# Patient Record
Sex: Male | Born: 2000
Health system: Southern US, Community
[De-identification: ages and names within clinical notes are randomized; demographics above are authoritative.]

## PROBLEM LIST (undated history)

## (undated) DIAGNOSIS — F32A Depression, unspecified: Secondary | ICD-10-CM

## (undated) DIAGNOSIS — N200 Calculus of kidney: Secondary | ICD-10-CM

## (undated) DIAGNOSIS — Z87442 Personal history of urinary calculi: Secondary | ICD-10-CM

## (undated) DIAGNOSIS — F419 Anxiety disorder, unspecified: Secondary | ICD-10-CM

## (undated) HISTORY — DX: Personal history of urinary calculi: Z87.442

## (undated) HISTORY — DX: Calculus of kidney: N20.0

## (undated) HISTORY — DX: Depression, unspecified: F32.A

## (undated) HISTORY — PX: NO PAST SURGERIES: SHX2092

## (undated) HISTORY — DX: Anxiety disorder, unspecified: F41.9

---
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
## 2019-01-04 ENCOUNTER — Encounter: Payer: Self-pay | Admitting: Family Medicine

## 2019-01-04 ENCOUNTER — Telehealth (INDEPENDENT_AMBULATORY_CARE_PROVIDER_SITE_OTHER): Payer: BC Managed Care – PPO | Admitting: Family Medicine

## 2019-01-04 DIAGNOSIS — F419 Anxiety disorder, unspecified: Secondary | ICD-10-CM | POA: Diagnosis not present

## 2019-01-04 DIAGNOSIS — F321 Major depressive disorder, single episode, moderate: Secondary | ICD-10-CM

## 2019-01-04 MED ORDER — FLUOXETINE HCL 10 MG PO CAPS: 10.0000 mg | ORAL_CAPSULE | Freq: Every day | ORAL | 3 refills | Status: DC

## 2019-01-04 NOTE — Progress Notes (Signed)
Virtual Visit via Video Note  I connected with Warren Cole on 01/04/19 at  3:30 PM EDT by a video enabled telemedicine application and verified that I am speaking with the correct person using two identifiers. Location patient: home Location provider: work or home office Persons participating in the virtual visit: patient, provider  I discussed the limitations of evaluation and management by telemedicine and the availability of in person appointments. The patient expressed understanding and agreed to proceed.  Chief Complaint  Patient presents with  . Anxiety    anxiety and depression consult     HPI:  Warren Cole is a 18 y.o. male here to discuss concerns about anxiety/depression. Pt states he has had anxiety "his whole life" but in the past few months it has been "super bad". He states he is "over-thinking". Pt notes trouble falling and staying asleep, decreased appetite, trouble concentrating, feels very worried but can't pinpoint why.  Denies SI.  Pt has been taking melatonin 5-10mg  qHS for a few nights.   Psychiatrist or therapist: pt saw a therapist in elementary in school for anxiety Previous meds: none  Depression screen PHQ 2/9 01/04/2019  Decreased Interest 1  Down, Depressed, Hopeless 3  PHQ - 2 Score 4  Altered sleeping 3  Tired, decreased energy 3  Change in appetite 3  Feeling bad or failure about yourself  2  Trouble concentrating 1  Moving slowly or fidgety/restless 0  Suicidal thoughts 0  PHQ-9 Score 16   GAD 7 : Generalized Anxiety Score 01/04/2019  Nervous, Anxious, on Edge 3  Control/stop worrying 3  Worry too much - different things 3  Trouble relaxing 3  Restless 2  Easily annoyed or irritable 2  Afraid - awful might happen 2  Total GAD 7 Score 18    Past Medical History:  Diagnosis Date  . Asthma   . Kidney stones 2018    No past surgical history on file.  Family History  Problem Relation Cole of Onset  . Cancer Maternal  Grandmother   . Heart disease Maternal Grandmother   . Cancer Maternal Grandfather   . Hyperlipidemia Maternal Grandfather   . Hypertension Maternal Grandfather   . Cancer Paternal Grandmother   . Alcohol abuse Paternal Grandmother   . Cancer Paternal Grandfather   . Alcohol abuse Paternal Grandfather     Social History   Tobacco Use  . Smoking status: Never Smoker  . Smokeless tobacco: Never Used  Substance Use Topics  . Alcohol use: No  . Drug use: Not on file     Current Outpatient Medications:  .  FLUoxetine (PROZAC) 10 MG capsule, Take 1 capsule (10 mg total) by mouth daily., Disp: 90 capsule, Rfl: 3  No Known Allergies    ROS: See pertinent positives and negatives per HPI.   EXAM:  VITALS per patient if applicable: There were no vitals taken for this visit.   GENERAL: alert, oriented, appears well and in no acute distress  NECK: normal movements of the head and neck  LUNGS: on inspection no signs of respiratory distress, breathing rate appears normal, no obvious gross SOB, gasping or wheezing, no conversational dyspnea  CV: no obvious cyanosis  MS: moves all visible extremities without noticeable abnormality  PSYCH/NEURO: pleasant and cooperative, speech and thought processing grossly intact; somewhat flat affect, mostly one-word answers   ASSESSMENT AND PLAN: 1. Anxiety 2. Depression, major, single episode, moderate (HCC) - GAD-7 = 18 (severe), PHQ-9 = 16 (moderately severe)  Rx: - FLUoxetine (PROZAC) 10 MG capsule; Take 1 capsule (10 mg total) by mouth daily.  Dispense: 90 capsule; Refill: 3 - info given re: [facility] counseling and will send via MyChart - f/u in 3-4 wks or sooner PRN    I discussed the assessment and treatment plan with the patient. The patient was provided an opportunity to ask questions and all were answered. The patient agreed with the plan and demonstrated an understanding of the instructions.   The patient was advised to call back  or seek an in-person evaluation if the symptoms worsen or if the condition fails to improve as anticipated.   Luana ShuMary Virga Haltiwanger, DO

## 2019-02-20 ENCOUNTER — Encounter: Payer: Self-pay | Admitting: Family Medicine

## 2019-02-20 DIAGNOSIS — F419 Anxiety disorder, unspecified: Secondary | ICD-10-CM

## 2019-02-20 MED ORDER — FLUOXETINE HCL 20 MG PO CAPS: 20.0000 mg | ORAL_CAPSULE | Freq: Every day | ORAL | 3 refills | Status: DC

## 2019-04-12 ENCOUNTER — Encounter: Payer: Self-pay | Admitting: Family Medicine

## 2019-04-18 MED ORDER — ESCITALOPRAM OXALATE 10 MG PO TABS: ORAL_TABLET | ORAL | 3 refills | Status: DC

## 2019-05-15 IMAGING — DX DG FOOT COMPLETE 3+V*L*
3 series · 3 of 3 positions shown · non-contrast
Comparison: None.

CLINICAL DATA: 16 y/o  M; lateral foot pain after inversion.

EXAM:
LEFT FOOT - COMPLETE 3+ VIEW

[foot ap]
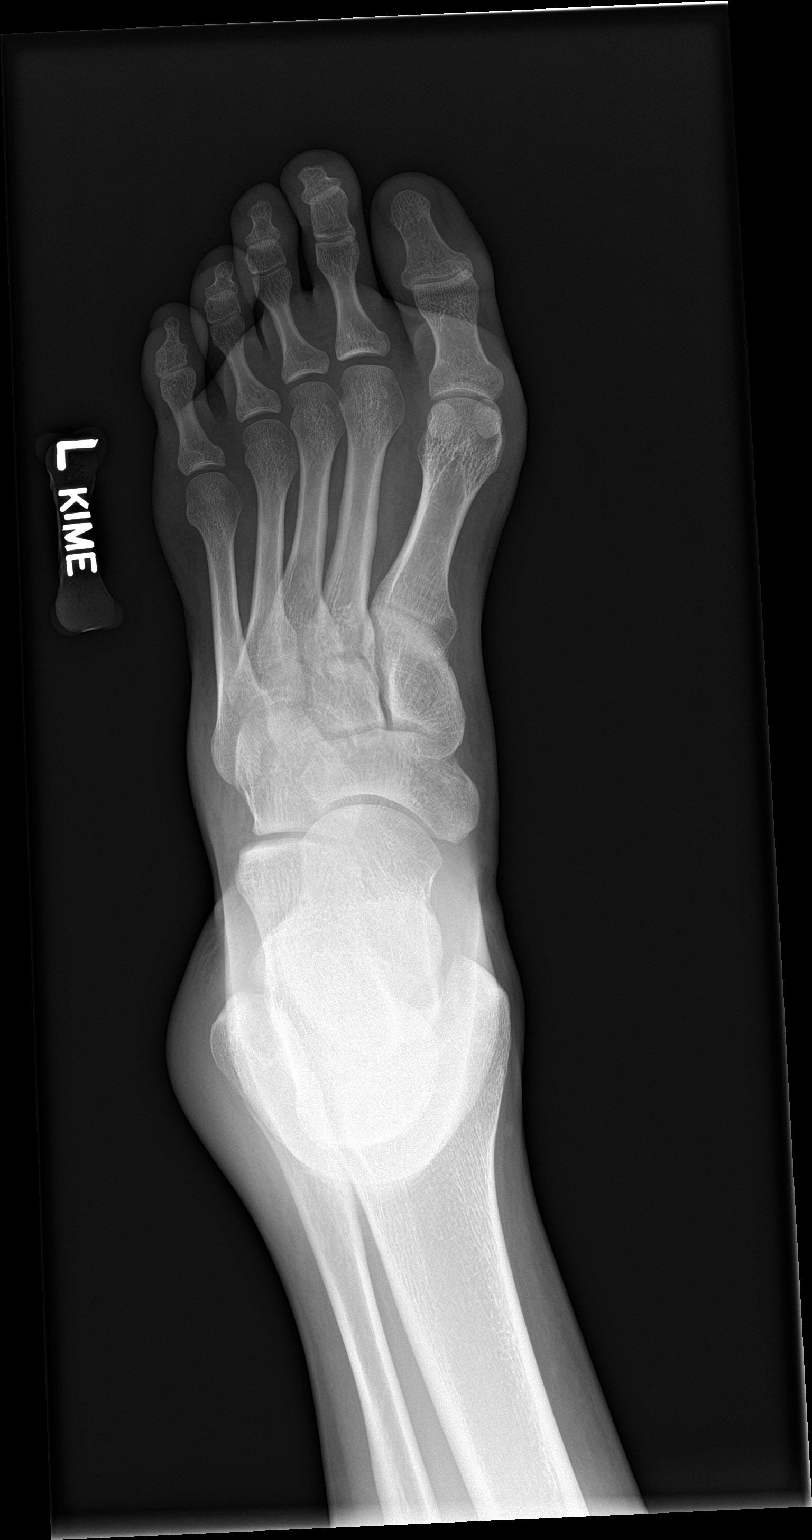

[foot obl]
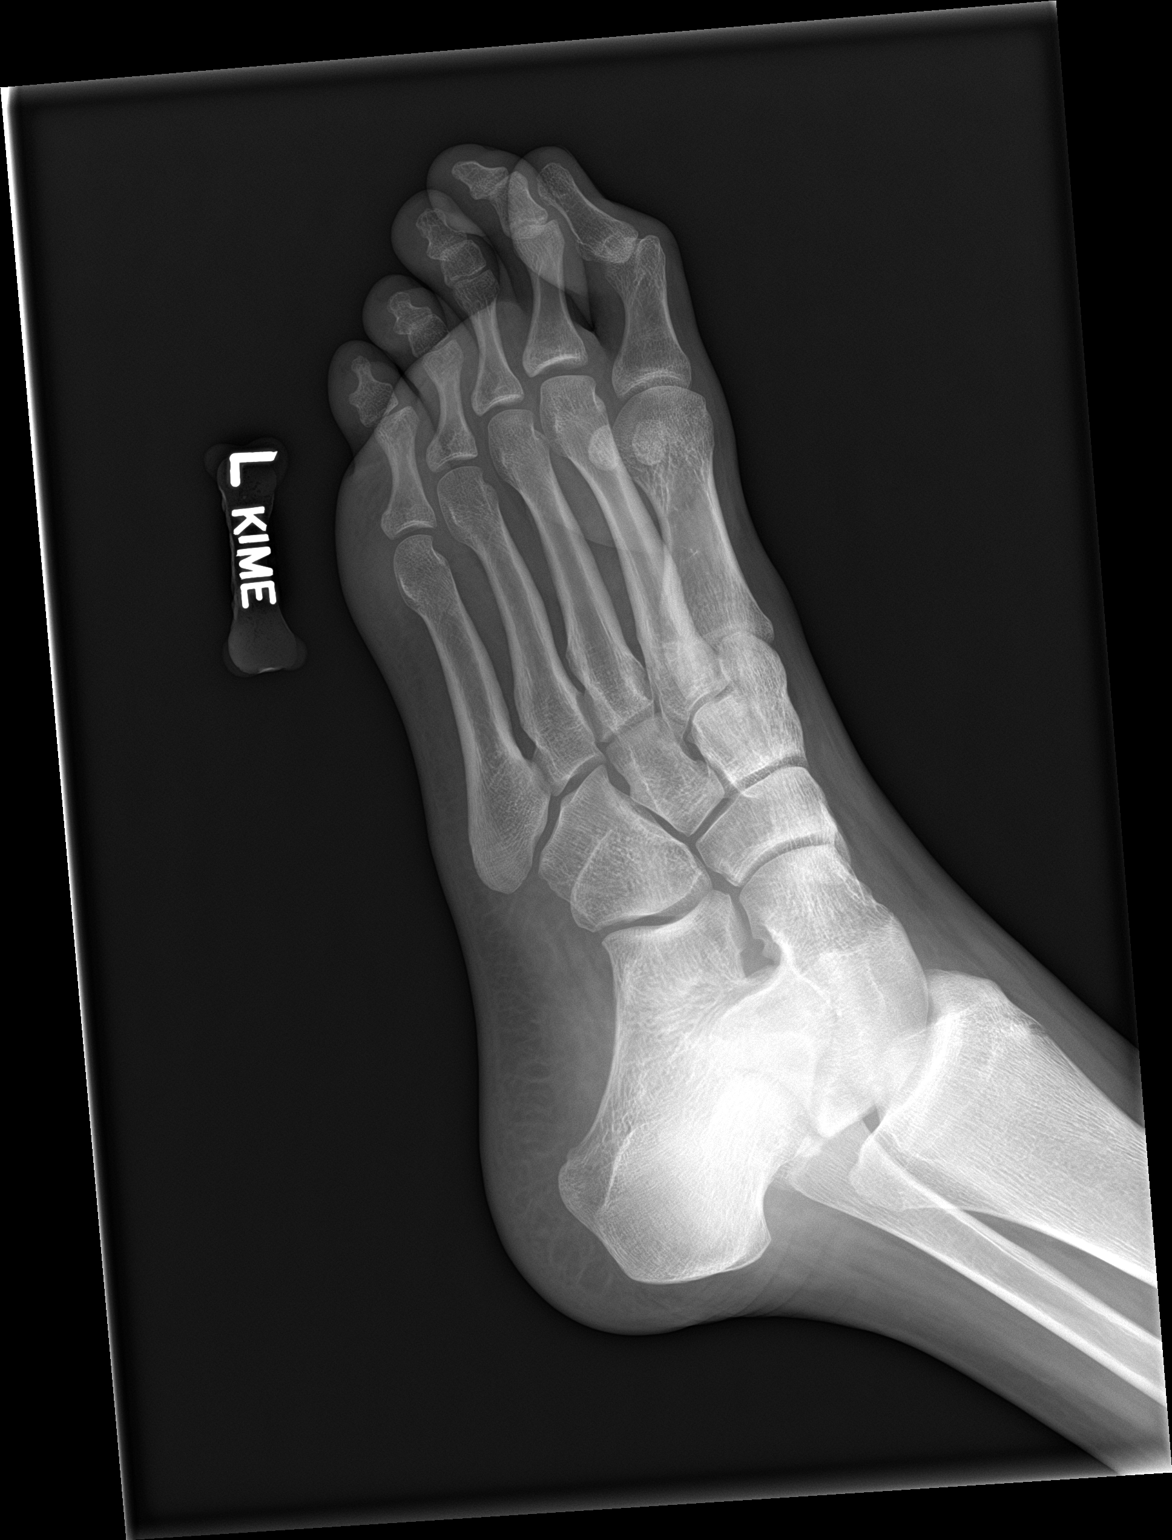

[foot lat]
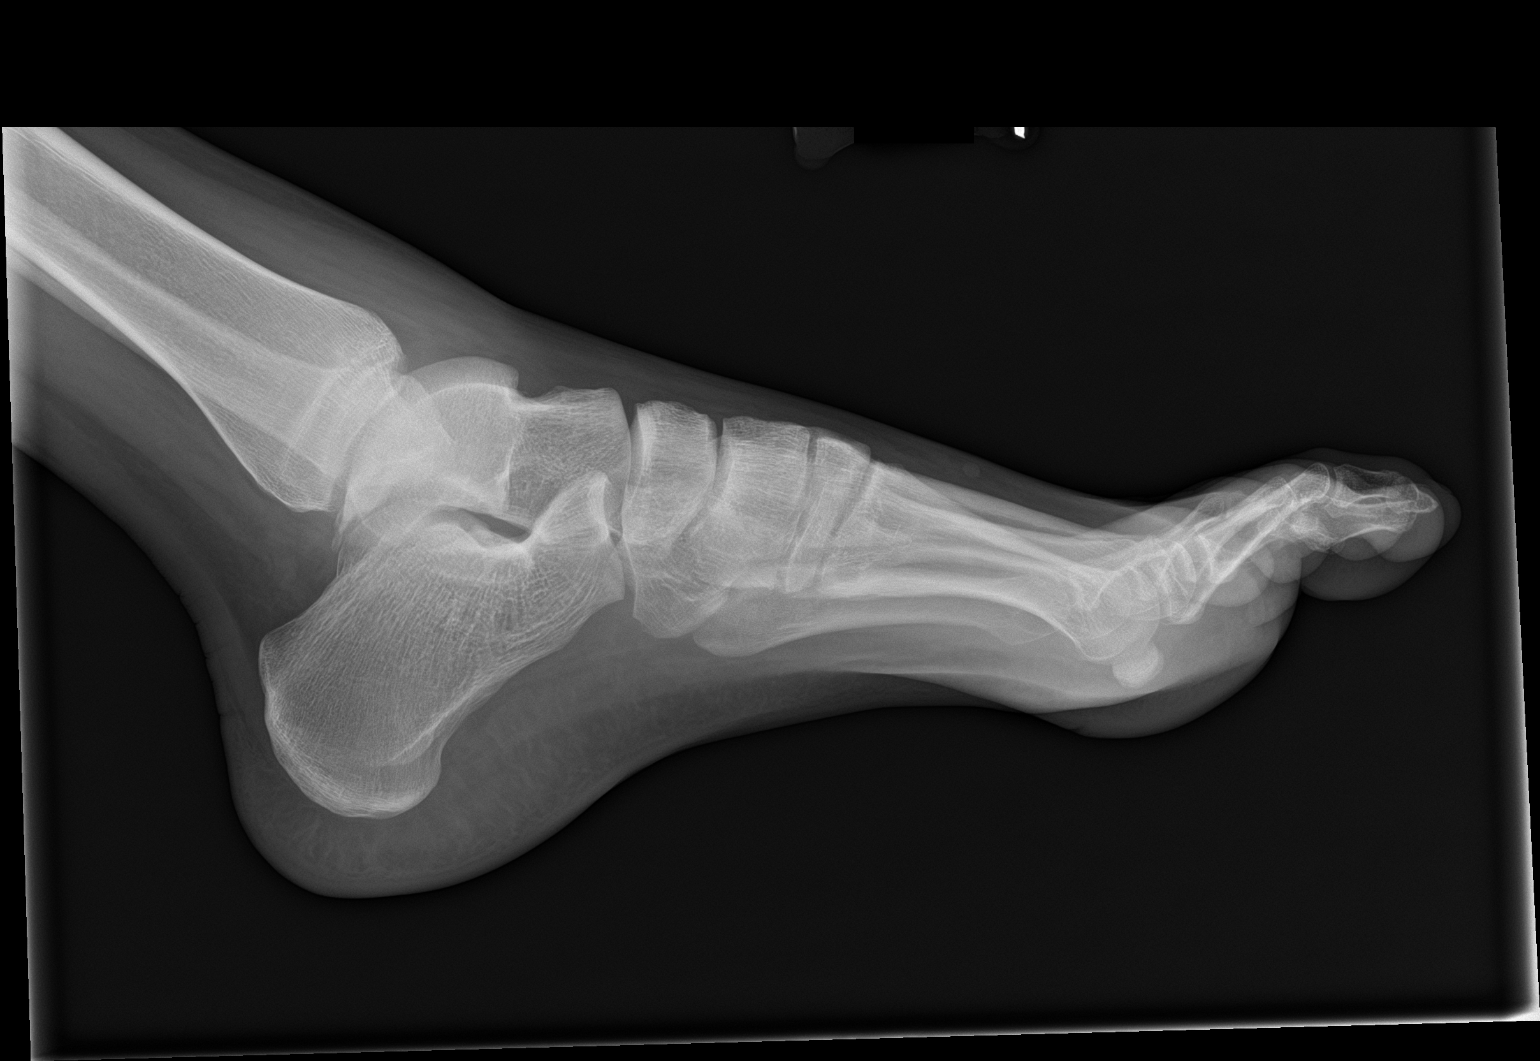

[3 of 3 positions shown; findings below may reference images not displayed]

FINDINGS: There is no evidence of fracture or dislocation. There is no
evidence of arthropathy or other focal bone abnormality. Soft
tissues are unremarkable.
IMPRESSION: Negative.

By: Daman Cayo M.D.

## 2019-05-15 IMAGING — DX DG ANKLE COMPLETE 3+V*L*
3 series · 3 of 3 positions shown · non-contrast
Comparison: None.

CLINICAL DATA: Basketball injury to the left ankle with swelling
and pain.

EXAM:
LEFT ANKLE COMPLETE - 3+ VIEW

[ankle ap]
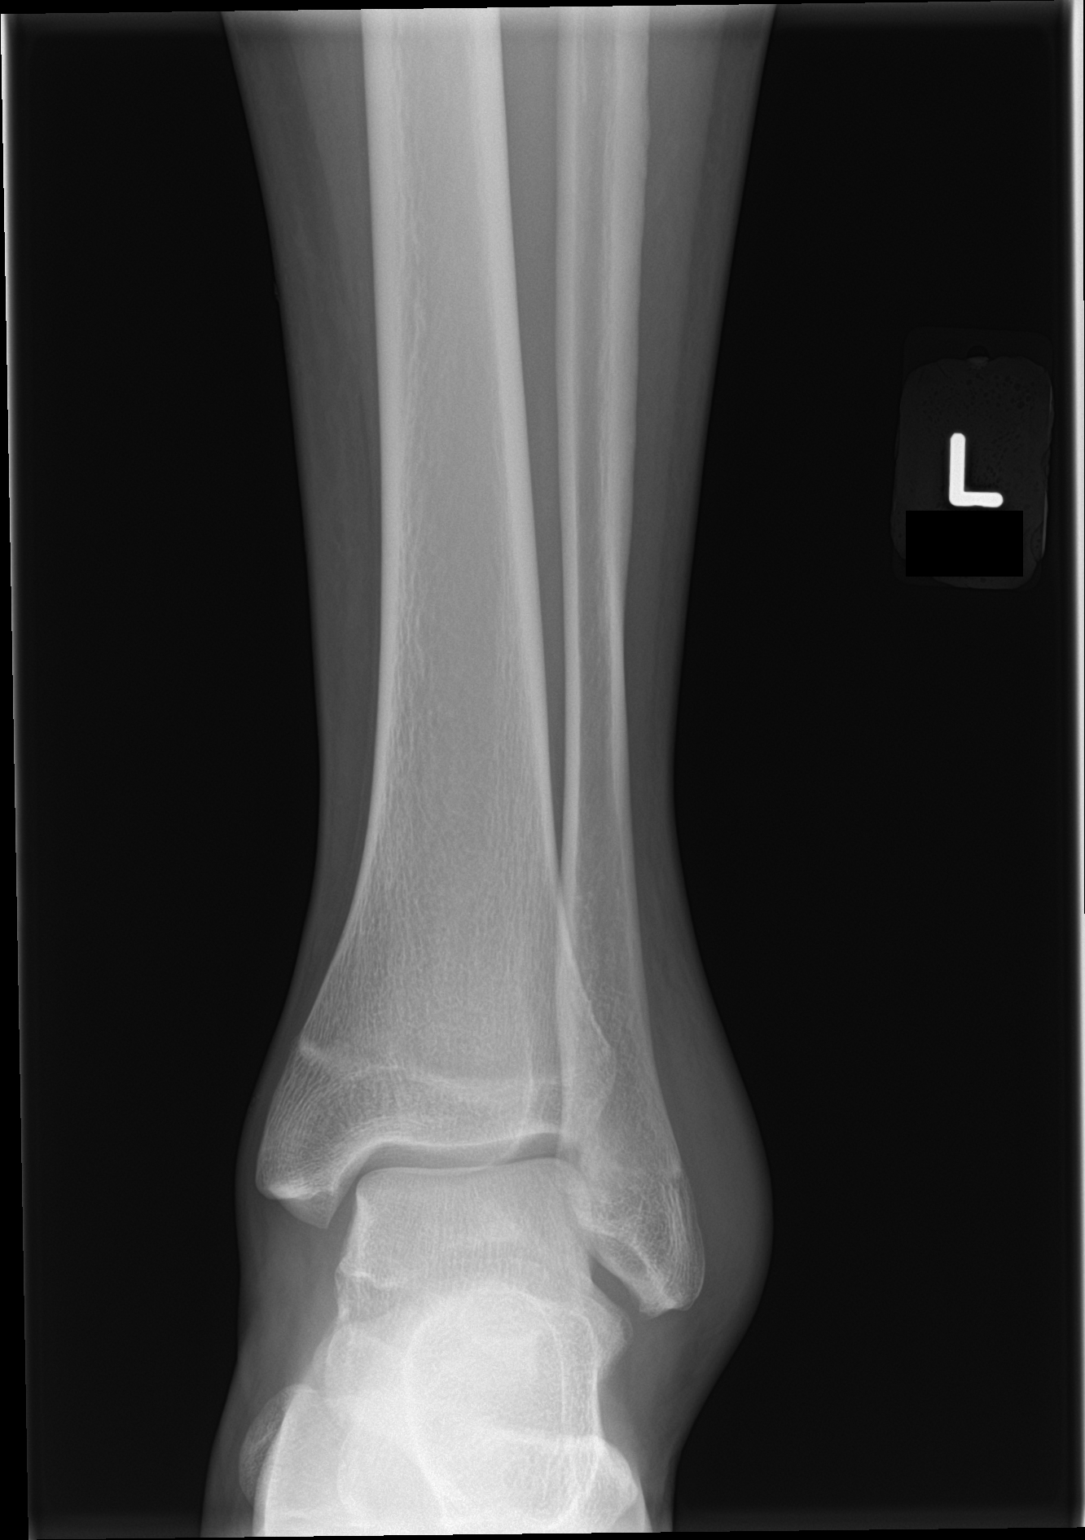

[ankle obl]
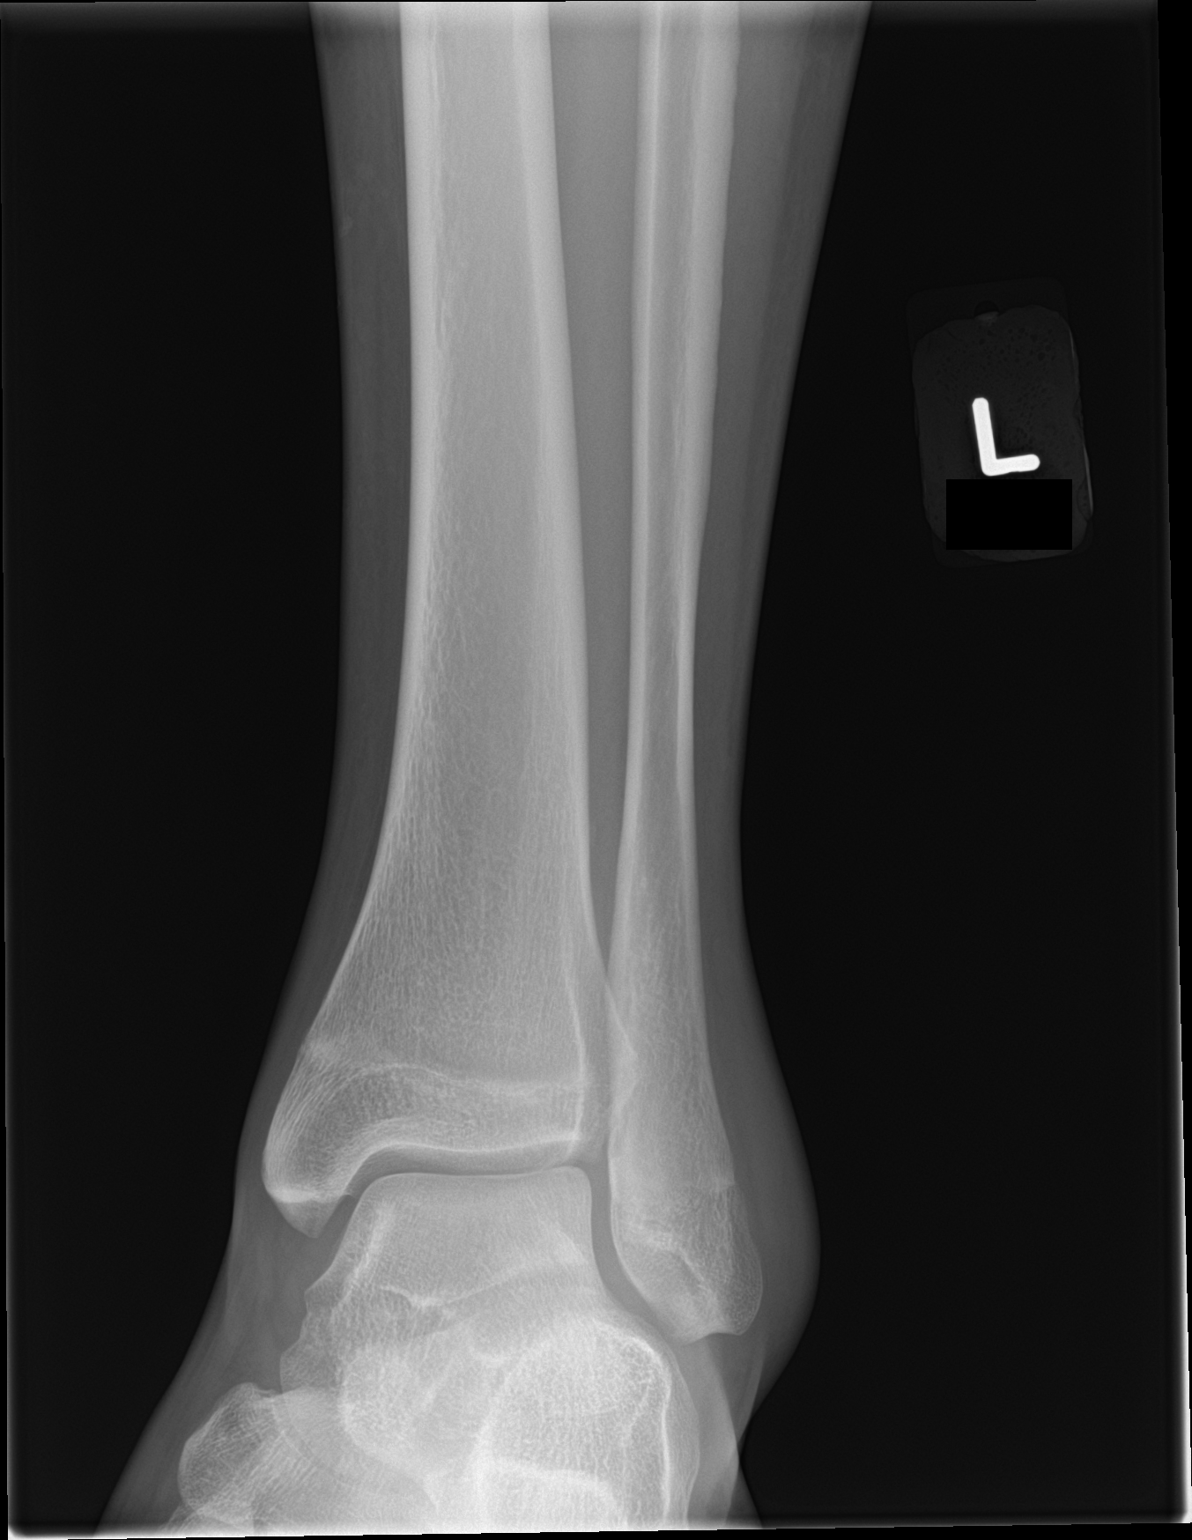

[ankle lat]
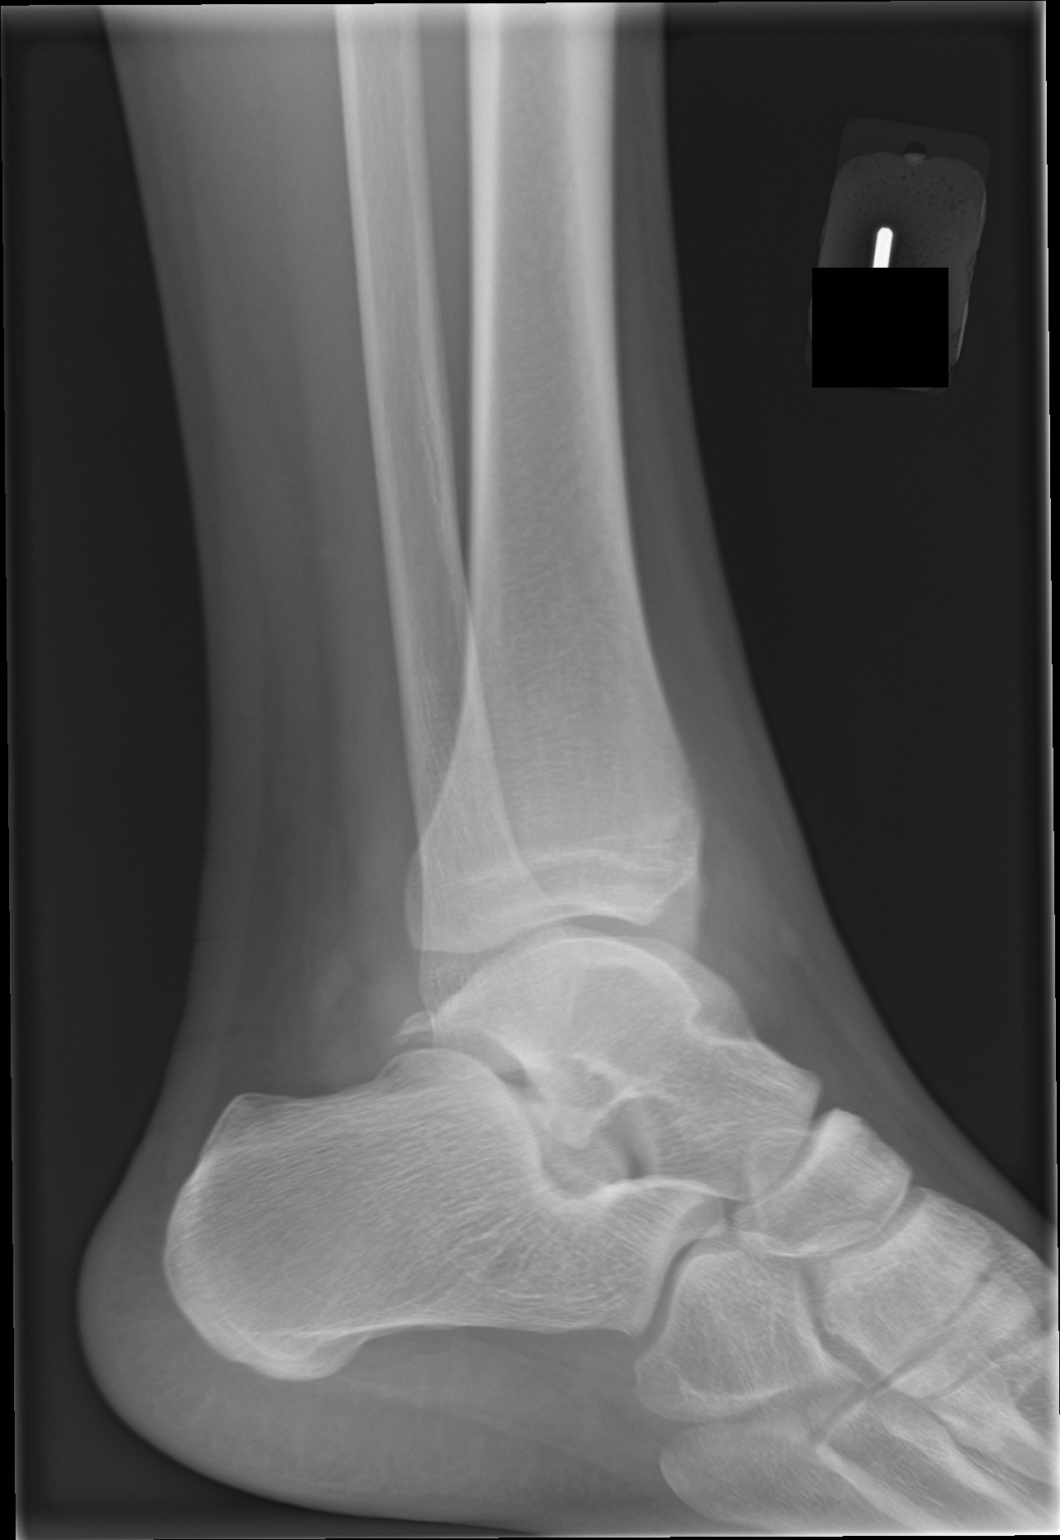

[3 of 3 positions shown; findings below may reference images not displayed]

FINDINGS: Soft tissue swelling about the lateral aspect of the left ankle. No
evidence of acute fracture or dislocation. No focal bone lesion or
bone destruction. Ankle mortise and talar dome appear intact. Joint
spaces are preserved.
IMPRESSION: Lateral soft tissue swelling over the left ankle. No acute bony
abnormalities.

## 2019-06-26 ENCOUNTER — Encounter: Payer: Self-pay | Admitting: Family Medicine

## 2019-06-26 DIAGNOSIS — F321 Major depressive disorder, single episode, moderate: Secondary | ICD-10-CM

## 2019-06-26 DIAGNOSIS — F419 Anxiety disorder, unspecified: Secondary | ICD-10-CM

## 2019-06-27 ENCOUNTER — Encounter: Payer: Self-pay | Admitting: Family Medicine

## 2019-06-27 MED ORDER — ESCITALOPRAM OXALATE 20 MG PO TABS: 20.0000 mg | ORAL_TABLET | Freq: Every day | ORAL | 0 refills | Status: DC

## 2019-07-02 DIAGNOSIS — Z20828 Contact with and (suspected) exposure to other viral communicable diseases: Secondary | ICD-10-CM | POA: Diagnosis not present

## 2019-07-02 DIAGNOSIS — Z9189 Other specified personal risk factors, not elsewhere classified: Secondary | ICD-10-CM | POA: Diagnosis not present

## 2019-08-21 ENCOUNTER — Encounter: Payer: Self-pay | Admitting: Family Medicine

## 2019-08-21 DIAGNOSIS — F419 Anxiety disorder, unspecified: Secondary | ICD-10-CM

## 2019-08-21 DIAGNOSIS — F321 Major depressive disorder, single episode, moderate: Secondary | ICD-10-CM

## 2019-08-30 ENCOUNTER — Encounter: Payer: BC Managed Care – PPO | Admitting: Family Medicine

## 2019-09-07 ENCOUNTER — Telehealth: Payer: Self-pay | Admitting: Family Medicine

## 2019-09-07 ENCOUNTER — Encounter: Payer: Self-pay | Admitting: Nurse Practitioner

## 2019-09-07 ENCOUNTER — Other Ambulatory Visit: Payer: Self-pay

## 2019-09-07 ENCOUNTER — Telehealth (INDEPENDENT_AMBULATORY_CARE_PROVIDER_SITE_OTHER): Payer: PRIVATE HEALTH INSURANCE | Admitting: Nurse Practitioner

## 2019-09-07 VITALS — HR 81 | Ht 75.23 in | Wt 214.0 lb

## 2019-09-07 DIAGNOSIS — J069 Acute upper respiratory infection, unspecified: Secondary | ICD-10-CM

## 2019-09-07 DIAGNOSIS — U071 COVID-19: Secondary | ICD-10-CM | POA: Diagnosis not present

## 2019-09-07 MED ORDER — HYDROCODONE-HOMATROPINE 5-1.5 MG/5ML PO SYRP: 5.0000 mL | ORAL_SOLUTION | Freq: Two times a day (BID) | ORAL | 0 refills | Status: DC | PRN

## 2019-09-07 MED ORDER — GUAIFENESIN-DM 100-10 MG/5ML PO SYRP: 5.0000 mL | ORAL_SOLUTION | ORAL | 0 refills | Status: DC | PRN

## 2019-09-07 MED ORDER — ACETAMINOPHEN 500 MG PO TABS: 500.0000 mg | ORAL_TABLET | Freq: Four times a day (QID) | ORAL | 0 refills | Status: DC | PRN

## 2019-09-07 MED ORDER — FLUTICASONE PROPIONATE 50 MCG/ACT NA SUSP: 2.0000 | Freq: Every day | NASAL | 0 refills | Status: DC

## 2019-09-07 NOTE — Telephone Encounter (Signed)
It is under the letter section. Pt is aware.

## 2019-09-07 NOTE — Telephone Encounter (Signed)
Patient is calling back and stated that he had a mychart visit today with Claris Gower and was suppose to receive a doctors note but hasn't yet. Pls advise. CB is [phone]

## 2019-09-07 NOTE — Progress Notes (Signed)
Virtual Visit via Video Note  I connected with@ on 09/07/19 at 12:30 PM EST by a video enabled telemedicine application and verified that I am speaking with the correct person using two identifiers.  Location: Patient:Home Provider: Office Participants: patient and provider  I discussed the limitations of evaluation and management by telemedicine and the availability of in person appointments. I also discussed with the patient that there may be a patient responsible charge related to this service. The patient expressed understanding and agreed to proceed.  CC:  History of Present Illness: Sore Throat  This is a new problem. The current episode started 1 to 4 weeks ago. The problem has been waxing and waning. There has been no fever. Associated symptoms include congestion, coughing and trouble swallowing. Pertinent negatives include no abdominal pain, diarrhea, drooling, ear discharge, ear pain, headaches, hoarse voice, plugged ear sensation, neck pain, shortness of breath, stridor, swollen glands or vomiting. He has had no exposure to strep or mono. He has tried nothing for the symptoms.   Observations/Objective: Physical Exam  Constitutional: He is oriented to person, place, and time.  HENT:  Right Ear: External ear normal.  Left Ear: External ear normal.  Mouth/Throat: Uvula is midline. No trismus in the jaw. Posterior oropharyngeal erythema present. No oropharyngeal exudate or posterior oropharyngeal edema.  Eyes: Conjunctivae and EOM are normal.  Pulmonary/Chest: Effort normal.  Musculoskeletal:     Cervical back: Normal range of motion and neck supple.  Lymphadenopathy:    He has no cervical adenopathy.  Neurological: He is alert and oriented to person, place, and time.  Skin: Skin is warm and dry. No rash noted.  Psychiatric: He has a normal mood and affect. His behavior is normal. Thought content normal.   Assessment and Plan: Corrigan was seen today for sore  throat.  Diagnoses and all orders for this visit:  COVID-19 -     HYDROcodone-homatropine (HYCODAN) 5-1.5 MG/5ML syrup; Take 5 mLs by mouth every 12 (twelve) hours as needed. -     guaiFENesin-dextromethorphan (ROBITUSSIN DM) 100-10 MG/5ML syrup; Take 5 mLs by mouth every 4 (four) hours as needed for cough. -     acetaminophen (TYLENOL) 500 MG tablet; Take 1 tablet (500 mg total) by mouth every 6 (six) hours as needed. -     fluticasone (FLONASE) 50 MCG/ACT nasal spray; Place 2 sprays into both nostrils daily.  Viral upper respiratory tract infection -     HYDROcodone-homatropine (HYCODAN) 5-1.5 MG/5ML syrup; Take 5 mLs by mouth every 12 (twelve) hours as needed. -     guaiFENesin-dextromethorphan (ROBITUSSIN DM) 100-10 MG/5ML syrup; Take 5 mLs by mouth every 4 (four) hours as needed for cough. -     acetaminophen (TYLENOL) 500 MG tablet; Take 1 tablet (500 mg total) by mouth every 6 (six) hours as needed. -     fluticasone (FLONASE) 50 MCG/ACT nasal spray; Place 2 sprays into both nostrils daily.   Follow Up Instructions: See avs   I discussed the assessment and treatment plan with the patient. The patient was provided an opportunity to ask questions and all were answered. The patient agreed with the plan and demonstrated an understanding of the instructions.   The patient was advised to call back or seek an in-person evaluation if the symptoms worsen or if the condition fails to improve as anticipated.   Alysia Penna, NP

## 2019-09-09 ENCOUNTER — Encounter: Payer: Self-pay | Admitting: Nurse Practitioner

## 2019-09-12 ENCOUNTER — Encounter: Payer: Self-pay | Admitting: Nurse Practitioner

## 2019-09-12 DIAGNOSIS — J069 Acute upper respiratory infection, unspecified: Secondary | ICD-10-CM

## 2019-09-13 MED ORDER — BENZONATATE 100 MG PO CAPS: 100.0000 mg | ORAL_CAPSULE | Freq: Three times a day (TID) | ORAL | 0 refills | Status: DC | PRN

## 2019-10-23 NOTE — Patient Instructions (Signed)
Health Maintenance Due  Topic Date Due  . HIV Screening  Never done  . TETANUS/TDAP  Never done  . INFLUENZA VACCINE  03/03/2019    Depression screen PHQ 2/9 01/04/2019  Decreased Interest 1  Down, Depressed, Hopeless 3  PHQ - 2 Score 4  Altered sleeping 3  Tired, decreased energy 3  Change in appetite 3  Feeling bad or failure about yourself  2  Trouble concentrating 1  Moving slowly or fidgety/restless 0  Suicidal thoughts 0  PHQ-9 Score 16

## 2019-10-24 ENCOUNTER — Other Ambulatory Visit: Payer: Self-pay

## 2019-10-24 ENCOUNTER — Encounter: Payer: PRIVATE HEALTH INSURANCE | Admitting: Family Medicine

## 2019-10-25 ENCOUNTER — Ambulatory Visit (INDEPENDENT_AMBULATORY_CARE_PROVIDER_SITE_OTHER): Payer: PRIVATE HEALTH INSURANCE | Admitting: Family Medicine

## 2019-10-25 ENCOUNTER — Encounter: Payer: Self-pay | Admitting: Family Medicine

## 2019-10-25 VITALS — BP 120/70 | HR 82 | Temp 97.6°F | Ht 74.5 in | Wt 227.0 lb

## 2019-10-25 DIAGNOSIS — Z0000 Encounter for general adult medical examination without abnormal findings: Secondary | ICD-10-CM | POA: Diagnosis not present

## 2019-10-25 DIAGNOSIS — F321 Major depressive disorder, single episode, moderate: Secondary | ICD-10-CM | POA: Diagnosis not present

## 2019-10-25 NOTE — Progress Notes (Signed)
Warren Cole is a 19 y.o. male  Chief Complaint  Patient presents with  . Annual Exam    Pt has no complaints.  He is not fasting for labs today. Pt recieved the [employer] vaccine last Friday.    HPI: Warren Cole is a 19 y.o. male here for annual exams.  Psychiatrist recently started him on zoloft 50mg  daily and ? Hydroxyzine qHS PRN. He is unsure of name of psych, seen x 1.   10/19/19 - COVID vaccine Diet/Exercise: average diet, regular CV exercerise Denitst: due and has appt next week Vision: no issues/concerns, does not wear glasses or contacts  Med refills needed today: no  Past Medical History:  Diagnosis Date  . Asthma   . Kidney stones 2018    History reviewed. No pertinent surgical history.  Social History   Socioeconomic History  . Marital status: Single    Spouse name: Not on file  . Number of children: Not on file  . Years of education: Not on file  . Highest education level: Not on file  Occupational History  . Not on file  Tobacco Use  . Smoking status: Never Smoker  . Smokeless tobacco: Never Used  Substance and Sexual Activity  . Alcohol use: No  . Drug use: Not on file  . Sexual activity: Not on file  Other Topics Concern  . Not on file  Social History Narrative  . Not on file   Social Determinants of Health   Financial Resource Strain:   . Difficulty of Paying Living Expenses:   Food Insecurity:   . Worried About Charity fundraiser in the Last Year:   . Arboriculturist in the Last Year:   Transportation Needs:   . Film/video editor (Medical):   Marland Kitchen Lack of Transportation (Non-Medical):   Physical Activity:   . Days of Exercise per Week:   . Minutes of Exercise per Session:   Stress:   . Feeling of Stress :   Social Connections:   . Frequency of Communication with Friends and Family:   . Frequency of Social Gatherings with Friends and Family:   . Attends Religious Services:   . Active Member of  Clubs or Organizations:   . Attends Archivist Meetings:   Marland Kitchen Marital Status:   Intimate Partner Violence:   . Fear of Current or Ex-Partner:   . Emotionally Abused:   Marland Kitchen Physically Abused:   . Sexually Abused:     Family History  Problem Relation Age of Onset  . Cancer Maternal Grandmother   . Heart disease Maternal Grandmother   . Cancer Maternal Grandfather   . Hyperlipidemia Maternal Grandfather   . Hypertension Maternal Grandfather   . Cancer Paternal Grandmother   . Alcohol abuse Paternal Grandmother   . Cancer Paternal Grandfather   . Alcohol abuse Paternal Grandfather       There is no immunization history on file for this patient.  Outpatient Encounter Medications as of 10/25/2019  Medication Sig  . acetaminophen (TYLENOL) 500 MG tablet Take 1 tablet (500 mg total) by mouth every 6 (six) hours as needed.  . fluticasone (FLONASE) 50 MCG/ACT nasal spray Place 2 sprays into both nostrils daily.  . sertraline (ZOLOFT) 50 MG tablet Take 50 mg by mouth daily.  . [DISCONTINUED] benzonatate (TESSALON) 100 MG capsule Take 1 capsule (100 mg total) by mouth 3 (three) times daily as needed for cough.  . [DISCONTINUED] escitalopram (  LEXAPRO) 20 MG tablet Take 1 tablet (20 mg total) by mouth at bedtime. (Patient not taking: Reported on 10/25/2019)  . [DISCONTINUED] guaiFENesin-dextromethorphan (ROBITUSSIN DM) 100-10 MG/5ML syrup Take 5 mLs by mouth every 4 (four) hours as needed for cough. (Patient not taking: Reported on 10/25/2019)  . [DISCONTINUED] HYDROcodone-homatropine (HYCODAN) 5-1.5 MG/5ML syrup Take 5 mLs by mouth every 12 (twelve) hours as needed. (Patient not taking: Reported on 10/25/2019)   No facility-administered encounter medications on file as of 10/25/2019.     ROS: Gen: no fever, chills  Skin: no rash, itching ENT: no ear pain, ear drainage, nasal congestion, rhinorrhea, sinus pressure, sore throat Eyes: no blurry vision, double vision Resp: no cough,  wheeze,SOB CV: no CP, palpitations, LE edema,  GI: no heartburn, n/v/d/c, abd pain GU: no dysuria, urgency, frequency, hematuria; no testicular swelling or masses MSK: no joint pain, myalgias, back pain Neuro: no dizziness, headache, weakness, vertigo Psych: no depression, anxiety, insomnia   No Known Allergies  BP 120/70 (BP Location: Left Arm, Patient Position: Sitting, Cuff Size: Normal)   Pulse 82   Temp 97.6 F (36.4 C) (Temporal)   Ht 6' 2.5" (1.892 m)   Wt 227 lb (103 kg)   SpO2 97%   BMI 28.76 kg/m   Physical Exam  Constitutional: He is oriented to person, place, and time. He appears well-developed and well-nourished. No distress.  HENT:  Head: Normocephalic and atraumatic.  Right Ear: Tympanic membrane and ear canal normal.  Left Ear: Tympanic membrane and ear canal normal.  Nose: Nose normal.  Mouth/Throat: Oropharynx is clear and moist and mucous membranes are normal.  Neck: No thyromegaly present.  Cardiovascular: Normal rate, regular rhythm, normal heart sounds and intact distal pulses.  Pulmonary/Chest: Effort normal and breath sounds normal. He has no wheezes. He has no rhonchi.  Abdominal: Soft. Bowel sounds are normal. He exhibits no distension and no mass. There is no abdominal tenderness. There is no rebound and no guarding.  Musculoskeletal:        General: No edema. Normal range of motion.     Cervical back: Neck supple.  Lymphadenopathy:    He has no cervical adenopathy.  Neurological: He is alert and oriented to person, place, and time. He exhibits normal muscle tone. Coordination normal.  Skin: Skin is warm and dry.  Psychiatric: He has a normal mood and affect.     A/P:  1. Annual physical exam - discussed importance of regular CV exercise, healthy diet, adequate sleep - immunizations UTD - dental appt scheduled - will plan on labs at CPE next year, pt is not fasting today - next CPE in 1 year  2. Depression, major, single episode, moderate  (HCC) - has established with psychiatry and had 1 visit - started on zoloft 50mg  daily and PRN ? Hydroxyzine (pt unsure of med name/dose) - doing well overall  This visit occurred during the SARS-CoV-2 public health emergency.  Safety protocols were in place, including screening questions prior to the visit, additional usage of staff PPE, and extensive cleaning of exam room while observing appropriate contact time as indicated for disinfecting solutions.

## 2019-11-23 ENCOUNTER — Encounter: Payer: Self-pay | Admitting: Family Medicine

## 2021-03-09 ENCOUNTER — Encounter: Payer: PRIVATE HEALTH INSURANCE | Admitting: Family Medicine

## 2021-03-26 ENCOUNTER — Ambulatory Visit: Payer: No Typology Code available for payment source | Admitting: Nurse Practitioner

## 2021-09-11 ENCOUNTER — Telehealth: Payer: Self-pay | Admitting: Family Medicine

## 2021-09-11 NOTE — Telephone Encounter (Signed)
Pt's mom is requesting TOC to [facility]. Please advise.

## 2021-10-06 ENCOUNTER — Encounter: Payer: Self-pay | Admitting: Family Medicine

## 2021-10-06 ENCOUNTER — Ambulatory Visit (INDEPENDENT_AMBULATORY_CARE_PROVIDER_SITE_OTHER): Payer: PRIVATE HEALTH INSURANCE | Admitting: Family Medicine

## 2021-10-06 VITALS — BP 129/73 | HR 76 | Ht 75.0 in | Wt 253.4 lb

## 2021-10-06 DIAGNOSIS — Z23 Encounter for immunization: Secondary | ICD-10-CM

## 2021-10-06 DIAGNOSIS — F32A Depression, unspecified: Secondary | ICD-10-CM | POA: Diagnosis not present

## 2021-10-06 DIAGNOSIS — F419 Anxiety disorder, unspecified: Secondary | ICD-10-CM | POA: Diagnosis not present

## 2021-10-06 MED ORDER — SERTRALINE HCL 50 MG PO TABS: 50.0000 mg | ORAL_TABLET | Freq: Every day | ORAL | 1 refills | Status: DC

## 2021-10-06 NOTE — Patient Instructions (Signed)
Thank you for choosing [facility] for your Primary Care needs. I am excited for the opportunity to partner with you to meet your health care goals. It was a pleasure meeting you today! ° ° ° °Information on diet, exercise, and health maintenance recommendations are listed below. This is information to help you be sure you are on track for optimal health and monitoring.  ° °Please look over this and let us know if you have any questions or if you have completed any of the health maintenance outside of [place] so that we can be sure your records are up to date.  °___________________________________________________________ ° °MyChart:  °For all urgent or time sensitive needs we ask that you please call the office to avoid delays. Our number is [phone]. °MyChart is not constantly monitored and due to the large volume of messages a day, replies may take up to 72 business hours. ° °MyChart Policy: °MyChart allows for you to see your visit notes, after visit summary, provider recommendations, lab and tests results, make an appointment, request refills, and contact your provider or the office for non-urgent questions or concerns. Providers are seeing patients during normal business hours and do not have built in time to review MyChart messages.  °We ask that you allow a minimum of 3 business days for responses to MyChart messages. For this reason, please do not send urgent requests through MyChart. Please call the office at [phone]. °New and ongoing conditions may require a visit. We have virtual and in-person visits available for your convenience.  °Complex MyChart concerns may require a visit. Your provider may request you schedule a virtual or in-person visit to ensure we are providing the best care possible. °MyChart messages sent after 11:00 AM on Friday will not be received by the provider until Monday morning.  °  °Lab and Test Results: °You will receive your lab and  test results on MyChart as soon as they are completed and results have been sent by the lab or testing facility. Due to this service, you will receive your results BEFORE your provider.  °I review lab and test results each morning prior to seeing patients. Some results require collaboration with other providers to ensure you are receiving the most appropriate care. For this reason, we ask that you please allow a minimum of 3-5 business days from the time that ALL results have been received for your provider to receive and review lab and test results and contact you about these.  °Most lab and test result comments from the provider will be sent through MyChart. Your provider may recommend changes to the plan of care, follow-up visits, repeat testing, ask questions, or request an office visit to discuss these results. You may reply directly to this message or call the office to provide information for the provider or set up an appointment. °In some instances, you will be called with test results and recommendations. Please let us know if this is preferred and we will make note of this in your chart to provide this for you.    °If you have not heard a response to your lab or test results in 5 business days from all results returning to MyChart, please call the office to let us know. We ask that you please avoid calling prior to this time unless there is an emergent concern. Due to high call volumes, this can delay the resulting process. ° °After Hours: °For all non-emergency after hours   needs, please call the office at [phone] and select the option to reach the on-call  service. On-call services are shared between multiple [facility] offices and therefore it will not be possible to speak directly with your provider. On-call providers may provide medical advice and recommendations, but are unable to provide refills for maintenance medications.  °For all emergency or urgent medical needs after normal business  hours, we recommend that you seek care at the closest Urgent Care or Emergency Department to ensure appropriate treatment in a timely manner.  °[facility] has a 24 hour emergency room located on the ground floor for your convenience.  ° °Urgent Concerns During the Business Day °Providers are seeing patients from 8AM to 5PM with a busy schedule and are most often not able to respond to non-urgent calls until the end of the day or the next business day. °If you should have URGENT concerns during the day, please call and speak to the nurse or schedule a same day appointment so that we can address your concern without delay.  ° °Thank you, again, for choosing me as your health care partner. I appreciate your trust and look forward to learning more about you.  ° °Arlenis Blaydes B. Haniya Fern, DNP, FNP-C ° °___________________________________________________________ ° °Health Maintenance Recommendations °Screening Testing °Mammogram °Every 1-2 years based on history and risk factors °Starting at age 50 °Pap Smear °Ages 21-39 every 3 years °Ages 30-65 every 5 years with HPV testing °More frequent testing may be required based on results and history °Colon Cancer Screening °Every 1-10 years based on test performed, risk factors, and history °Starting at age 45 °Bone Density Screening °Every 2-10 years based on history °Starting at age 65 for women °Recommendations for men differ based on medication usage, history, and risk factors °AAA Screening °One time ultrasound °Men 65-75 years old who have ever smoked °Lung Cancer Screening °Low Dose Lung CT every 12 months °Age 50-80 years with a 20 pack-year smoking history who still smoke or who have quit within the last 15 years ° °Screening Labs °Routine  Labs: Complete Blood Count (CBC), Complete Metabolic Panel (CMP), Cholesterol (Lipid Panel) °Every 6-12 months based on history and medications °May be recommended more frequently based on current conditions or  previous results °Hemoglobin A1c Lab °Every 3-12 months based on history and previous results °Starting at age 45 or earlier with diagnosis of diabetes, high cholesterol, BMI >26, and/or risk factors °Frequent monitoring for patients with diabetes to ensure blood sugar control °Thyroid Panel (TSH w/ T3 & T4) °Every 6 months based on history, symptoms, and risk factors °May be repeated more often if on medication °HIV °One time testing for all patients 13 and older °May be repeated more frequently for patients with increased risk factors or exposure °Hepatitis C °One time testing for all patients 18 and older °May be repeated more frequently for patients with increased risk factors or exposure °Gonorrhea, Chlamydia °Every 12 months for all sexually active persons 13-24 years °Additional monitoring may be recommended for those who are considered high risk or who have symptoms °PSA °Men 40-54 years old with risk factors °Additional screening may be recommended from age 55-69 based on risk factors, symptoms, and history ° °Vaccine Recommendations °Tetanus Booster °All adults every 10 years °Flu Vaccine °All patients 6 months and older every year °COVID Vaccine °All patients 12 years and older °Initial dosing with booster °May recommend additional booster based on age and health history °HPV Vaccine °2 doses all   patients age 9-26 °Dosing may be considered for patients over 26 °Shingles Vaccine (Shingrix) °2 doses all adults 50 years and older °Pneumonia (Pneumovax 23) °All adults 65 years and older °May recommend earlier dosing based on health history °Pneumonia (Prevnar 13) °All adults 65 years and older °Dosed 1 year after Pneumovax 23 °Pneumonia (Prevnar 20) °All adults 65 years and older (adults 19-64 with certain conditions or risk factors) °1 dose  °For those who have no received Prevnar 13 vaccine previously ° ° °Additional Screening, Testing, and Vaccinations may be recommended on an individualized basis based on  family history, health history, risk factors, and/or exposure.  °__________________________________________________________ ° °Diet Recommendations for All Patients ° °I recommend that all patients maintain a diet low in saturated fats, carbohydrates, and cholesterol. While this can be challenging at first, it is not impossible and small changes can make big differences.  °Things to try: °Decreasing the amount of soda, sweet tea, and/or juice to one or less per day and replace with water °While water is always the first choice, if you do not like water you may consider °adding a water additive without sugar to improve the taste °other sugar free drinks °Replace potatoes with a brightly colored vegetable at dinner °Use healthy oils, such as canola oil or olive oil, instead of butter or hard margarine °Limit your bread intake to two pieces or less a day °Replace regular pasta with low carb pasta options °Bake, broil, or grill foods instead of frying °Monitor portion sizes  °Eat smaller, more frequent meals throughout the day instead of large meals ° °An important thing to remember is, if you love foods that are not great for your health, you don't have to give them up completely. Instead, allow these foods to be a reward when you have done well. Allowing yourself to still have special treats every once in a while is a nice way to tell yourself thank you for working hard to keep yourself healthy.  ° °Also remember that every day is a new day. If you have a bad day and "fall off the wagon", you can still climb right back up and keep moving along on your journey! ° °We have resources available to help you!  °Some websites that may be helpful include: °www.MyPlate.gov  °Www.VeryWellFit.com °_____________________________________________________________ ° °Activity Recommendations for All Patients ° °I recommend that all adults get at least 20 minutes of moderate physical activity that elevates your heart rate at least 5  days out of the week.  °Some examples include: °Walking or jogging at a pace that allows you to carry on a conversation °Cycling (stationary bike or outdoors) °Water aerobics °Yoga °Weight lifting °Dancing °If physical limitations prevent you from putting stress on your joints, exercise in a pool or seated in a chair are excellent options. ° °Do determine your MAXIMUM heart rate for activity: YOUR AGE - 220 = MAX HeartRate  ° °Remember! °Do not push yourself too hard.  °Start slowly and build up your pace, speed, weight, time in exercise, etc.  °Allow your body to rest between exercise and get good sleep. °You will need more water than normal when you are exerting yourself. Do not wait until you are thirsty to drink. Drink with a purpose of getting in at least 8, 8 ounce glasses of water a day plus more depending on how much you exercise and sweat.  ° ° °If you begin to develop dizziness, chest pain, abdominal pain, jaw pain, shortness of breath, headache,   vision changes, lightheadedness, or other concerning symptoms, stop the activity and allow your body to rest. If your symptoms are severe, seek emergency evaluation immediately. If your symptoms are concerning, but not severe, please let us know so that we can recommend further evaluation.  ° ° ° °

## 2021-10-06 NOTE — Progress Notes (Signed)
? ?______________________________________________________________________ ? ?HPI ?Warren Cole is a 21 y.o. male presenting to [facility] at [facility] today to establish care.  ? ?Patient Care Team: ?Clayborne Dana, NP as PCP - General (Family Medicine) ? ?Health Maintenance  ?Topic Date Due  ? COVID-19 Vaccine (1) Never done  ? HIV Screening  Never done  ? Hepatitis C Screening: USPSTF Recommendation to screen - Ages 2-79 yo.  Never done  ? Flu Shot  03/02/2021  ? Tetanus Vaccine  10/07/2031  ? HPV Vaccine  Completed  ? ? ? ?Concerns today: ?Depression and anxiety: Currently on Zoloft 50 mg. Previously managed by neuropsych but very expensive copay so would like [place] to manage here. No SI/HI. No side effects.  ? ? ?Patient Active Problem List  ? Diagnosis Date Noted  ? Allergic rhinitis 01/04/2013  ? ? ?PHQ9 Today: ?Depression screen [facility] 2/9 10/06/2021 10/25/2019 01/04/2019  ?Decreased Interest 0 1 1  ?Down, Depressed, Hopeless 0 0 3  ?PHQ - 2 Score 0 1 4  ?Altered sleeping 1 2 3   ?Tired, decreased energy 0 1 3  ?Change in appetite 0 0 3  ?Feeling bad or failure about yourself  0 0 2  ?Trouble concentrating 0 3 1  ?Moving slowly or fidgety/restless 0 0 0  ?Suicidal thoughts 0 0 0  ?PHQ-9 Score 1 7 16   ?Difficult doing work/chores Not difficult at all Not difficult at all -  ? ?GAD7 Today: ?GAD 7 : Generalized Anxiety Score 10/06/2021 10/25/2019 01/04/2019  ?Nervous, Anxious, on Edge 1 2 3   ?Control/stop worrying 0 1 3  ?Worry too much - different things 0 1 3  ?Trouble relaxing 0 1 3  ?Restless 0 0 2  ?Easily annoyed or irritable 1 1 2   ?Afraid - awful might happen 0 1 2  ?Total GAD 7 Score 2 7 18   ?Anxiety Difficulty Not difficult at all Somewhat difficult -  ? ?______________________________________________________________________ ?PMH ?Past Medical History:  ?Diagnosis Date  ? Anxiety   ? Asthma   ? Depression   ? History of kidney stones   ? ? ?ROS ?All review of systems negative except what  is listed in the HPI ? ?PHYSICAL EXAM ?Physical Exam ?Vitals reviewed.  ?Constitutional:   ?   Appearance: Normal appearance.  ?HENT:  ?   Head: Normocephalic and atraumatic.  ?Cardiovascular:  ?   Rate and Rhythm: Normal rate and regular rhythm.  ?Pulmonary:  ?   Effort: Pulmonary effort is normal.  ?   Breath sounds: Normal breath sounds.  ?Neurological:  ?   General: No focal deficit present.  ?   Mental Status: He is alert and oriented to person, place, and time. Mental status is at baseline.  ?Psychiatric:     ?   Mood and Affect: Mood normal.     ?   Behavior: Behavior normal.     ?   Thought Content: Thought content normal.     ?   Judgment: Judgment normal.  ? ?______________________________________________________________________ ?ASSESSMENT AND PLAN ? ?1. Anxiety and depression ?Stable per PHQ9/GAD7. Continue at current dose - refill placed today. Denies SI/HI. Patient aware of signs/symptoms requiring further/urgent evaluation.  ?- sertraline (ZOLOFT) 50 MG tablet; Take 1 tablet (50 mg total) by mouth daily.  Dispense: 90 tablet; Refill: 1 ? ?2. Need for Tdap vaccination ?- Tdap vaccine greater than or equal to 7yo IM ? ? ?Establish care ?Education provided today during visit and on AVS for patient  to review at home.  ?Diet and Exercise recommendations provided.  ?Current diagnoses and recommendations discussed. ?HM recommendations reviewed with recommendations.  ? ? ?Outpatient Encounter Medications as of 10/06/2021  ?Medication Sig  ? sertraline (ZOLOFT) 50 MG tablet Take 1 tablet (50 mg total) by mouth daily.  ? [DISCONTINUED] acetaminophen (TYLENOL) 500 MG tablet Take 1 tablet (500 mg total) by mouth every 6 (six) hours as needed.  ? [DISCONTINUED] fluticasone (FLONASE) 50 MCG/ACT nasal spray Place 2 sprays into both nostrils daily.  ? [DISCONTINUED] sertraline (ZOLOFT) 50 MG tablet Take 50 mg by mouth daily.  ? ?No facility-administered encounter medications on file as of 10/06/2021.  ? ? ?Return in  about 3 months (around 01/06/2022) for physical w labs, mood f/u . ? ? ?Lollie Marrow Reola Calkins, DNP, FNP-C ? ? ?

## 2021-11-09 ENCOUNTER — Emergency Department (HOSPITAL_BASED_OUTPATIENT_CLINIC_OR_DEPARTMENT_OTHER)
Admission: EM | Admit: 2021-11-09 | Discharge: 2021-11-09 | Disposition: A | Discharge status: Home / Self Care | Payer: No Typology Code available for payment source | Attending: Emergency Medicine | Admitting: Emergency Medicine

## 2021-11-09 ENCOUNTER — Encounter (HOSPITAL_BASED_OUTPATIENT_CLINIC_OR_DEPARTMENT_OTHER): Payer: Self-pay | Admitting: Emergency Medicine

## 2021-11-09 ENCOUNTER — Other Ambulatory Visit: Payer: Self-pay

## 2021-11-09 ENCOUNTER — Emergency Department (HOSPITAL_BASED_OUTPATIENT_CLINIC_OR_DEPARTMENT_OTHER): Payer: No Typology Code available for payment source

## 2021-11-09 DIAGNOSIS — N200 Calculus of kidney: Secondary | ICD-10-CM

## 2021-11-09 DIAGNOSIS — D72829 Elevated white blood cell count, unspecified: Secondary | ICD-10-CM | POA: Insufficient documentation

## 2021-11-09 DIAGNOSIS — N132 Hydronephrosis with renal and ureteral calculous obstruction: Secondary | ICD-10-CM | POA: Insufficient documentation

## 2021-11-09 LAB — COMPREHENSIVE METABOLIC PANEL
ALT: 24 U/L (ref 0–44)
AST: 23 U/L (ref 15–41)
Albumin: 4.4 g/dL (ref 3.5–5.0)
Alkaline Phosphatase: 72 U/L (ref 38–126)
Anion gap: 11 (ref 5–15)
BUN: 12 mg/dL (ref 6–20)
CO2: 22 mmol/L (ref 22–32)
Calcium: 9.5 mg/dL (ref 8.9–10.3)
Chloride: 106 mmol/L (ref 98–111)
Creatinine, Ser: 0.87 mg/dL (ref 0.61–1.24)
GFR, Estimated: >60 mL/min (ref >60)
Glucose, Bld: 132 mg/dL — ABNORMAL HIGH (ref 70–99)
Potassium: 3.8 mmol/L (ref 3.5–5.1)
Sodium: 139 mmol/L (ref 135–145)
Total Bilirubin: 0.7 mg/dL (ref 0.3–1.2)
Total Protein: 8.3 g/dL — ABNORMAL HIGH (ref 6.5–8.1)

## 2021-11-09 LAB — CBC WITH DIFFERENTIAL/PLATELET
Abs Immature Granulocytes: 0.03 10*3/uL (ref 0.00–0.07)
Basophils Absolute: 0.1 10*3/uL (ref 0.0–0.1)
Basophils Relative: 0 %
Eosinophils Absolute: 0 10*3/uL (ref 0.0–0.5)
Eosinophils Relative: 0 %
HCT: 46.2 % (ref 39.0–52.0)
Hemoglobin: 17 g/dL (ref 13.0–17.0)
Immature Granulocytes: 0 %
Lymphocytes Relative: 12 %
Lymphs Abs: 1.7 10*3/uL (ref 0.7–4.0)
MCH: 31.5 pg (ref 26.0–34.0)
MCHC: 36.8 g/dL — ABNORMAL HIGH (ref 30.0–36.0)
MCV: 85.7 fL (ref 80.0–100.0)
Monocytes Absolute: 0.7 10*3/uL (ref 0.1–1.0)
Monocytes Relative: 5 %
Neutro Abs: 11.2 10*3/uL — ABNORMAL HIGH (ref 1.7–7.7)
Neutrophils Relative %: 83 %
Platelets: 302 10*3/uL (ref 150–400)
RBC: 5.39 MIL/uL (ref 4.22–5.81)
RDW: 11.9 % (ref 11.5–15.5)
WBC: 13.7 10*3/uL — ABNORMAL HIGH (ref 4.0–10.5)
nRBC: 0 % (ref 0.0–0.2)

## 2021-11-09 LAB — LIPASE, BLOOD: Lipase: 30 U/L (ref 11–51)

## 2021-11-09 LAB — URINALYSIS, ROUTINE W REFLEX MICROSCOPIC
Bilirubin Urine: NEGATIVE
Glucose, UA: NEGATIVE mg/dL
Leukocytes,Ua: NEGATIVE
Nitrite: NEGATIVE
Protein, ur: NEGATIVE mg/dL
Specific Gravity, Urine: >=1.03 (ref 1.005–1.030)
pH: 5.5 (ref 5.0–8.0)

## 2021-11-09 LAB — URINALYSIS, MICROSCOPIC (REFLEX)
Bacteria, UA: NONE SEEN
Squamous Epithelial / HPF: NONE SEEN (ref 0–5)
WBC, UA: NONE SEEN WBC/hpf (ref 0–5)

## 2021-11-09 MED ORDER — TAMSULOSIN HCL 0.4 MG PO CAPS
0.4000 mg | ORAL_CAPSULE | Freq: Once | ORAL | Status: AC
  Administered 2021-11-09: 0.4 mg via ORAL
  Filled 2021-11-09: qty 1

## 2021-11-09 MED ORDER — HYDROMORPHONE HCL 1 MG/ML IJ SOLN
1.0000 mg | Freq: Once | INTRAMUSCULAR | Status: AC
  Administered 2021-11-09: 1 mg via INTRAVENOUS
  Filled 2021-11-09: qty 1

## 2021-11-09 MED ORDER — OXYCODONE-ACETAMINOPHEN 5-325 MG PO TABS
1.0000 | ORAL_TABLET | Freq: Once | ORAL | Status: AC
  Administered 2021-11-09: 1 via ORAL
  Filled 2021-11-09: qty 1

## 2021-11-09 MED ORDER — ONDANSETRON HCL 4 MG PO TABS: 4.0000 mg | ORAL_TABLET | Freq: Four times a day (QID) | ORAL | 0 refills | Status: DC

## 2021-11-09 MED ORDER — KETOROLAC TROMETHAMINE 15 MG/ML IJ SOLN
15.0000 mg | Freq: Once | INTRAMUSCULAR | Status: AC
  Administered 2021-11-09: 15 mg via INTRAVENOUS
  Filled 2021-11-09: qty 1

## 2021-11-09 NOTE — Discharge Instructions (Addendum)
Your CT shows that you have a small 2 mm stone in your right ureter.  This should pass in couple of days.  Your first dose of Flomax was given here in the emergency department so you can begin taking your at home prescription tomorrow.  I believe your urologist sent this in along with a narcotic for pain.  I would also supplement with 800 mg ibuprofen every 6-8 hours, and you can take Zofran, and antinausea medication as needed as well. ? ?If by the end of week your pain is either increasing or you have not yet passed the stone, please follow-up with your urologist. ?

## 2021-11-09 NOTE — ED Triage Notes (Signed)
Right flank pain radiating to groin , Hx kidney stones. ?Oliguria , emesis . Denies hematuria . Seen by urologist  today , sent here for ct scan  ?

## 2021-11-09 NOTE — ED Notes (Signed)
Pt requesting something for pain. ED Provider made aware.  ?

## 2021-11-09 NOTE — ED Notes (Signed)
ED Provider at bedside. 

## 2021-11-09 NOTE — ED Notes (Signed)
Scanned 3 times, bladder scan and ultrasound and show no void space ?

## 2021-11-09 NOTE — ED Provider Notes (Signed)
?[facility] EMERGENC[facility] DEPARTMENT ?Provider Note ? ? ?CSN: [id number] ?Arrival date & time: 11/09/21  1620 ? ?  ? ?History ? ?Chief Complaint  ?Patient presents with  ? Urinary Retention  ? ? ?Warren Cole is a 21 y.o. male with history significant for kidney stones who presents to the ED for evaluation of right suprapubic pain and right flank pain that started initially last night and worsened significantly 2 hours ago.  Patient saw his urologist who referred him to the emergency department for CT.  Per patient, the last time he had a full express of urine this last night and today he has been having increased frequency with only small amounts of urine produced.  When asked where his pain is worse, patient is pointing toward his right groin.  I asked if this is where he typically feels his kidney stone pain and he says that he "think so" but it may be a little bit lower than usual.  He notes that urologist did not perform testicular examination.  He had 1 episode of vomiting but currently denies nausea.  He denies fever, chills, diarrhea, chest pain and all other complaints. ? ?HPI ? ?  ? ?Home Medications ?Prior to Admission medications   ?Medication Sig Start Date End Date Taking? Authorizing Provider  ?ondansetron (ZOFRAN) 4 MG tablet Take 1 tablet (4 mg total) by mouth every 6 (six) hours. 11/09/21  Yes Kathe Becton R, PA-C  ?oxyCODONE (OXY IR/ROXICODONE) 5 MG immediate release tablet Take by mouth. 11/09/21 11/14/21 Yes [provider]  ?tamsulosin (FLOMAX) 0.4 MG CAPS capsule Take by mouth. 11/09/21 12/09/21 Yes [provider]  ?sertraline (ZOLOFT) 50 MG tablet Take 1 tablet (50 mg total) by mouth daily. 10/06/21   Terrilyn Saver, NP  ?   ? ?Allergies    ?Patient has no known allergies.   ? ?Review of Systems   ?Review of Systems ? ?Physical Exam ?Updated Vital Signs ?BP (!) 146/90 (BP Location: Right Arm)   Pulse (!) 51   Temp 97.9 ?F (36.6 ?C) (Oral)   Resp 14   Wt 113.4  kg   SpO2 99%   BMI 31.25 kg/m?  ?Physical Exam ?Vitals and nursing note reviewed. Exam conducted with a chaperone present.  ?Constitutional:   ?   General: He is not in acute distress. ?   Appearance: He is not ill-appearing.  ?HENT:  ?   Head: Atraumatic.  ?Eyes:  ?   Conjunctiva/sclera: Conjunctivae normal.  ?Cardiovascular:  ?   Rate and Rhythm: Normal rate and regular rhythm.  ?   Pulses: Normal pulses.  ?   Heart sounds: No murmur heard. ?Pulmonary:  ?   Effort: Pulmonary effort is normal. No respiratory distress.  ?   Breath sounds: Normal breath sounds.  ?Abdominal:  ?   General: Abdomen is flat. There is no distension.  ?   Palpations: Abdomen is soft.  ?   Tenderness: There is abdominal tenderness in the suprapubic area. There is right CVA tenderness. There is no left CVA tenderness.  ?   Comments: Suprapubic pain extending diagonally up to the right flank, positive right-sided CVA tenderness  ?Genitourinary: ?   Penis: No phimosis.   ?   Testes:     ?   Right: Mass, tenderness, swelling, testicular hydrocele or varicocele not present. Cremasteric reflex is present.      ?   Left: Mass, tenderness, swelling, testicular hydrocele or varicocele not present. Cremasteric reflex is  present.   ?   Epididymis:  ?   Right: Normal.  ?   Left: Normal.  ?Musculoskeletal:     ?   General: Normal range of motion.  ?   Cervical back: Normal range of motion.  ?Skin: ?   General: Skin is warm and dry.  ?   Capillary Refill: Capillary refill takes less than 2 seconds.  ?Neurological:  ?   General: No focal deficit present.  ?   Mental Status: He is alert.  ?Psychiatric:     ?   Mood and Affect: Mood normal.  ? ? ?ED Results / Procedures / Treatments   ?Labs ?(all labs ordered are listed, but only abnormal results are displayed) ?Labs Reviewed  ?COMPREHENSIVE METABOLIC PANEL - Abnormal; Notable for the following components:  ?    Result Value  ? Glucose, Bld 132 (*)   ? Total Protein 8.3 (*)   ? All other components  within normal limits  ?CBC WITH DIFFERENTIAL/PLATELET - Abnormal; Notable for the following components:  ? WBC 13.7 (*)   ? MCHC 36.8 (*)   ? Neutro Abs 11.2 (*)   ? All other components within normal limits  ?URINALYSIS, ROUTINE W REFLEX MICROSCOPIC - Abnormal; Notable for the following components:  ? Hgb urine dipstick TRACE (*)   ? Ketones, ur TRACE (*)   ? All other components within normal limits  ?LIPASE, BLOOD  ?URINALYSIS, MICROSCOPIC (REFLEX)  ? ? ?EKG ?None ? ?Radiology ?CT Renal Stone Study ? ?Result Date: 11/09/2021 ?CLINICAL DATA:  Right flank pain. EXAM: CT ABDOMEN AND PELVIS WITHOUT CONTRAST TECHNIQUE: Multidetector CT imaging of the abdomen and pelvis was performed following the standard protocol without IV contrast. RADIATION DOSE REDUCTION: This exam was performed according to the departmental dose-optimization program which includes automated exposure control, adjustment of the mA and/or kV according to patient size and/or use of iterative reconstruction technique. COMPARISON:  August 01, 2017. FINDINGS: Lower chest: No acute abnormality. Hepatobiliary: No focal liver abnormality is seen. No gallstones, gallbladder wall thickening, or biliary dilatation. Pancreas: Unremarkable. No pancreatic ductal dilatation or surrounding inflammatory changes. Spleen: Normal in size without focal abnormality. Adrenals/Urinary Tract: Adrenal glands appear normal. Mild right hydroureteronephrosis is noted secondary to 2 mm calculus at the right ureterovesical junction. Urinary bladder is decompressed. Stomach/Bowel: Stomach is within normal limits. Appendix appears normal. No evidence of bowel wall thickening, distention, or inflammatory changes. Vascular/Lymphatic: No significant vascular findings are present. No enlarged abdominal or pelvic lymph nodes. Reproductive: Prostate is unremarkable. Other: No abdominal wall hernia or abnormality. No abdominopelvic ascites. Musculoskeletal: No fracture is seen.  IMPRESSION: Mild right hydroureteronephrosis is noted secondary to 2 mm calculus at the right ureterovesical junction. Electronically Signed   By: Marijo Conception M.D.   On: 11/09/2021 17:14   ? ?Procedures ?Procedures  ? ?Medications Ordered in ED ?Medications  ?HYDROmorphone (DILAUDID) injection 1 mg (1 mg Intravenous Given 11/09/21 1642)  ?tamsulosin (FLOMAX) capsule 0.4 mg (0.4 mg Oral Given 11/09/21 1819)  ?ketorolac (TORADOL) 15 MG/ML injection 15 mg (15 mg Intravenous Given 11/09/21 1830)  ?oxyCODONE-acetaminophen (PERCOCET/ROXICET) 5-325 MG per tablet 1 tablet (1 tablet Oral Given 11/09/21 1829)  ? ? ?ED Course/ Medical Decision Making/ A&P ?Clinical Course as of 11/09/21 1845  ?Mon Nov 09, 2021  ?[id number] CT Renal Stone Study [EC]  ?  ?Clinical Course User Index ?[EC] Tonye Pearson, PA-C  ? ?                        ?  Medical Decision Making ?Amount and/or Complexity of Data Reviewed ?Labs: ordered. ?Radiology: ordered. ? ?Risk ?Prescription drug management. ? ? ?History:  ?Per HPI ?Social determinants of health: None ? ?Initial impression: ? ?This patient presents to the ED for concern of right flank pain, this involves an extensive number of treatment options, and is a complaint that carries with it a high risk of complications and morbidity.   Differentials include kidney stone, pyelonephritis, testicular torsion, appendicitis, cholecystitis, cholelithiasis, gastroenteritis ? ? ?Lab Tests and EKG: ? ?I Ordered, reviewed, and interpreted labs and EKG.  The pertinent results include:  ?CBC with leukocytosis of 13.7 ? ? ?Imaging Studies ordered: ? ?I ordered imaging studies including  ?CT renal stone 2 mm stone in the right UVJ without evidence of obstruction.  Bladder decompressed.  ?I independently visualized and interpreted imaging and I agree with the radiologist interpretation.  ? ? ?Cardiac Monitoring: ? ?The patient was maintained on a cardiac monitor.  I personally viewed and interpreted the cardiac  monitored which showed an underlying rhythm of: NSR ? ? ?Medicines ordered and prescription drug management: ? ?I ordered medication including: ?Dilaudid 1 mg ?Flomax 0.4mg  ?Percocet ?Reevaluation of the patient after these m

## 2021-12-18 ENCOUNTER — Ambulatory Visit (INDEPENDENT_AMBULATORY_CARE_PROVIDER_SITE_OTHER): Payer: No Typology Code available for payment source | Admitting: Family Medicine

## 2021-12-18 ENCOUNTER — Encounter: Payer: Self-pay | Admitting: Family Medicine

## 2021-12-18 VITALS — BP 111/65 | HR 60 | Ht 75.0 in | Wt 245.8 lb

## 2021-12-18 DIAGNOSIS — F419 Anxiety disorder, unspecified: Secondary | ICD-10-CM

## 2021-12-18 DIAGNOSIS — F32A Depression, unspecified: Secondary | ICD-10-CM | POA: Diagnosis not present

## 2021-12-18 DIAGNOSIS — R41840 Attention and concentration deficit: Secondary | ICD-10-CM | POA: Diagnosis not present

## 2021-12-18 MED ORDER — SERTRALINE HCL 25 MG PO TABS: 25.0000 mg | ORAL_TABLET | Freq: Every day | ORAL | 1 refills | Status: DC

## 2021-12-18 MED ORDER — SERTRALINE HCL 100 MG PO TABS: 100.0000 mg | ORAL_TABLET | Freq: Every day | ORAL | Status: DC

## 2021-12-18 NOTE — Progress Notes (Signed)
   Acute Office Visit  Subjective:     Patient ID: Warren Cole, male    DOB: 03-18-2001, 21 y.o.   MRN: [id number]  CC: mood, anxiety    HPI Patient is in today for anxiety.   Patient reports his anxiety/OCD has significantly worsened over the past month or so. States he cannot shut his mind off, but cannot think of any specific triggers. Reports he had been doing well on Zoloft 100 mg for over a year, until recently worsening mood. States he has had to use one of his mom's medications to help him sleep occasionally (he thinks it might be Xanax). States he did counseling a year or so ago and it seemed to help; he is willing to start again, but he thinks his Zoloft needs to be increased. He is also requesting to try something like Xanax; states he has tried hydroxyzine in the past with no significant improvement.   He is also wondering if there may be an ADHD factor playing a role as well - he would like testing.   States that he will occasionally have thoughts of not wanting to be here anymore, but has never had thought/plans to hurt himself or others. Denies SI/HI.     ROS All review of systems negative except what is listed in the HPI      Objective:    BP 111/65   Pulse 60   Ht 6\' 3"  (1.905 m)   Wt 245 lb 12.8 oz (111.5 kg)   BMI 30.72 kg/m    Physical Exam Vitals reviewed.  Constitutional:      General: He is not in acute distress.    Appearance: Normal appearance. He is not ill-appearing.  Cardiovascular:     Rate and Rhythm: Normal rate and regular rhythm.  Pulmonary:     Effort: Pulmonary effort is normal.     Breath sounds: Normal breath sounds.  Neurological:     General: No focal deficit present.     Mental Status: He is alert and oriented to person, place, and time. Mental status is at baseline.  Psychiatric:        Mood and Affect: Mood normal.        Behavior: Behavior normal.        Thought Content: Thought content normal.        Judgment:  Judgment normal.       No results found for any visits on 12/18/21.      Assessment & Plan:   1. Anxiety and depression Increasing Zoloft to 125 mg daily to see if this will help - he has not had side effects in the past. Discussed PRN medications and offered to prescribe hydroxyzine, but he declined stating it has never helped before. Reminded him that I prefer not starting benzos, but he is willing to see psych for med management to further evaluate and add if needed. Also provided a list of local resources. No SI/HI.  - Ambulatory referral to Behavioral Health - sertraline (ZOLOFT) 100 MG tablet; Take 1 tablet (100 mg total) by mouth daily. - sertraline (ZOLOFT) 25 MG tablet; Take 1 tablet (25 mg total) by mouth daily. Take with the 100 mg tabs to total 125 mg daily  Dispense: 90 tablet; Refill: 1  2. Poor concentration Patient requesting ADHD testing. Referral placed.  - Ambulatory referral to Behavioral Health   Return in about 6 weeks (around 01/29/2022) for mood f/u.  01/31/2022, NP

## 2021-12-18 NOTE — Patient Instructions (Signed)
[facility] - Urgent care services - Outpatient services: Individual Therapy Partial Hospitalization Program (PHP) Substance Abuse Intensive Outpatient Program (SAIOP) Specialized Intensive Adult Group Therapy Medication Management Peer Living Room  Phone: [phone]  Address: [address] [place], [place] [address]  Hours: Open 24/7, No appointment required.  

## 2022-06-30 ENCOUNTER — Emergency Department (HOSPITAL_BASED_OUTPATIENT_CLINIC_OR_DEPARTMENT_OTHER)
Admission: EM | Admit: 2022-06-30 | Discharge: 2022-06-30 | Disposition: A | Discharge status: Home / Self Care | Payer: No Typology Code available for payment source | Attending: Emergency Medicine | Admitting: Emergency Medicine

## 2022-06-30 DIAGNOSIS — R319 Hematuria, unspecified: Secondary | ICD-10-CM | POA: Insufficient documentation

## 2022-06-30 DIAGNOSIS — Z87442 Personal history of urinary calculi: Secondary | ICD-10-CM | POA: Insufficient documentation

## 2022-06-30 DIAGNOSIS — N23 Unspecified renal colic: Secondary | ICD-10-CM | POA: Insufficient documentation

## 2022-06-30 DIAGNOSIS — R1031 Right lower quadrant pain: Secondary | ICD-10-CM | POA: Diagnosis present

## 2022-06-30 LAB — URINALYSIS, ROUTINE W REFLEX MICROSCOPIC
Glucose, UA: NEGATIVE mg/dL
Ketones, ur: NEGATIVE mg/dL
Leukocytes,Ua: NEGATIVE
Nitrite: NEGATIVE
Protein, ur: 30 mg/dL — AB
Specific Gravity, Urine: >=1.03 (ref 1.005–1.030)
pH: 5.5 (ref 5.0–8.0)

## 2022-06-30 LAB — CBC WITH DIFFERENTIAL/PLATELET
Abs Immature Granulocytes: 0.02 10*3/uL (ref 0.00–0.07)
Basophils Absolute: 0.1 10*3/uL (ref 0.0–0.1)
Basophils Relative: 1 %
Eosinophils Absolute: 0.2 10*3/uL (ref 0.0–0.5)
Eosinophils Relative: 3 %
HCT: 45.6 % (ref 39.0–52.0)
Hemoglobin: 16.4 g/dL (ref 13.0–17.0)
Immature Granulocytes: 0 %
Lymphocytes Relative: 44 %
Lymphs Abs: 3 10*3/uL (ref 0.7–4.0)
MCH: 30.9 pg (ref 26.0–34.0)
MCHC: 36 g/dL (ref 30.0–36.0)
MCV: 86 fL (ref 80.0–100.0)
Monocytes Absolute: 0.8 10*3/uL (ref 0.1–1.0)
Monocytes Relative: 12 %
Neutro Abs: 2.8 10*3/uL (ref 1.7–7.7)
Neutrophils Relative %: 40 %
Platelets: 283 10*3/uL (ref 150–400)
RBC: 5.3 MIL/uL (ref 4.22–5.81)
RDW: 12 % (ref 11.5–15.5)
WBC: 6.8 10*3/uL (ref 4.0–10.5)
nRBC: 0 % (ref 0.0–0.2)

## 2022-06-30 LAB — COMPREHENSIVE METABOLIC PANEL
ALT: 30 U/L (ref 0–44)
AST: 28 U/L (ref 15–41)
Albumin: 4.1 g/dL (ref 3.5–5.0)
Alkaline Phosphatase: 66 U/L (ref 38–126)
Anion gap: 9 (ref 5–15)
BUN: 13 mg/dL (ref 6–20)
CO2: 23 mmol/L (ref 22–32)
Calcium: 9.1 mg/dL (ref 8.9–10.3)
Chloride: 107 mmol/L (ref 98–111)
Creatinine, Ser: 0.99 mg/dL (ref 0.61–1.24)
GFR, Estimated: >60 mL/min (ref >60)
Glucose, Bld: 138 mg/dL — ABNORMAL HIGH (ref 70–99)
Potassium: 3.6 mmol/L (ref 3.5–5.1)
Sodium: 139 mmol/L (ref 135–145)
Total Bilirubin: 1.2 mg/dL (ref 0.3–1.2)
Total Protein: 7.5 g/dL (ref 6.5–8.1)

## 2022-06-30 LAB — URINALYSIS, MICROSCOPIC (REFLEX): RBC / HPF: >50 RBC/hpf (ref 0–5)

## 2022-06-30 MED ORDER — SODIUM CHLORIDE 0.9 % IV BOLUS
500.0000 mL | Freq: Once | INTRAVENOUS | Status: AC
  Administered 2022-06-30: 500 mL via INTRAVENOUS

## 2022-06-30 MED ORDER — KETOROLAC TROMETHAMINE 15 MG/ML IJ SOLN
15.0000 mg | Freq: Once | INTRAMUSCULAR | Status: AC
  Administered 2022-06-30: 15 mg via INTRAVENOUS
  Filled 2022-06-30: qty 1

## 2022-06-30 MED ORDER — HYDROMORPHONE HCL 1 MG/ML IJ SOLN
1.0000 mg | Freq: Once | INTRAMUSCULAR | Status: AC
  Administered 2022-06-30: 1 mg via INTRAVENOUS
  Filled 2022-06-30: qty 1

## 2022-06-30 MED ORDER — ONDANSETRON HCL 4 MG/2ML IJ SOLN
4.0000 mg | Freq: Once | INTRAMUSCULAR | Status: AC
  Administered 2022-06-30: 4 mg via INTRAVENOUS
  Filled 2022-06-30: qty 2

## 2022-06-30 MED ORDER — TAMSULOSIN HCL 0.4 MG PO CAPS
0.4000 mg | ORAL_CAPSULE | Freq: Every day | ORAL | 0 refills | Status: DC
Start: 2022-06-30 — End: 2023-04-19

## 2022-06-30 MED ORDER — OXYCODONE-ACETAMINOPHEN 5-325 MG PO TABS: 1.0000 | ORAL_TABLET | Freq: Four times a day (QID) | ORAL | 0 refills | Status: DC | PRN

## 2022-06-30 NOTE — ED Provider Notes (Signed)
Patient initially seen by Dr. Madilyn Hook.  Please see her note.  Plan was to follow-up on the UA.  Many bacteria noted but only 0-5 white blood cells.  Negative nitrite leukocyte esterase.  Not suggestive of infection.  Plan is for discharge home for empiric treatment of kidney stone.   Linwood Dibbles, MD 06/30/22 [phone]

## 2022-06-30 NOTE — ED Provider Notes (Signed)
[facility] EMERGENCY DEPARTMENT Provider Note   CSN: [id number] Arrival date & time: 06/30/22  0500     History  No chief complaint on file.   Warren Cole is a 21 y.o. male.  The history is provided by the patient and medical records.  Warren Cole is a 21 y.o. male who presents to the Emergency Department complaining of any stone.  He presents to the emergency department for evaluation of pain that feels like his prior kidney stones.  Pain started around 8 PM.  It is located in his right lower quadrant and radiates to his right flank.  Has associated sweating episodes and hematuria.  He states that he is only able to get a small amount of blood tinged urine out and feels like he has to urinate more but cannot.  No dysuria.  No fevers, vomiting, diarrhea.  He has a history of 2 prior kidney stones, most recently was several months ago.  He is unsure if he passed his last kidney stone.  No additional medical problems.     Home Medications Prior to Admission medications   Medication Sig Start Date End Date Taking? Authorizing Provider  oxyCODONE-acetaminophen (PERCOCET/ROXICET) 5-325 MG tablet Take 1 tablet by mouth every 6 (six) hours as needed for severe pain. 06/30/22  Yes Quintella Reichert, MD  tamsulosin (FLOMAX) 0.4 MG CAPS capsule Take 1 capsule (0.4 mg total) by mouth daily. 06/30/22  Yes Quintella Reichert, MD  ondansetron (ZOFRAN) 4 MG tablet Take 1 tablet (4 mg total) by mouth every 6 (six) hours. 11/09/21   Tonye Pearson, PA-C  sertraline (ZOLOFT) 100 MG tablet Take 1 tablet (100 mg total) by mouth daily. 12/18/21   Terrilyn Saver, NP  sertraline (ZOLOFT) 25 MG tablet Take 1 tablet (25 mg total) by mouth daily. Take with the 100 mg tabs to total 125 mg daily 12/18/21   Terrilyn Saver, NP      Allergies    Patient has no known allergies.    Review of Systems   Review of Systems  All other systems reviewed and are negative.   Physical  Exam Updated Vital Signs BP 128/89   Pulse 71   Temp 97.6 F (36.4 C) (Oral)   Resp 16   Ht 6\' 4"  (1.93 m)   Wt 113.4 kg   SpO2 94%   BMI 30.43 kg/m  Physical Exam Vitals and nursing note reviewed.  Constitutional:      Appearance: He is well-developed. He is diaphoretic.     Comments: Appears uncomfortable  HENT:     Head: Normocephalic and atraumatic.  Cardiovascular:     Rate and Rhythm: Normal rate and regular rhythm.  Pulmonary:     Effort: Pulmonary effort is normal.     Breath sounds: Normal breath sounds.  Abdominal:     Palpations: Abdomen is soft.     Tenderness: There is no guarding or rebound.     Comments: Mild right lower quadrant tenderness  Musculoskeletal:        General: No tenderness.  Skin:    General: Skin is warm.  Neurological:     Mental Status: He is alert and oriented to person, place, and time.  Psychiatric:        Behavior: Behavior normal.     ED Results / Procedures / Treatments   Labs (all labs ordered are listed, but only abnormal results are displayed) Labs Reviewed  COMPREHENSIVE METABOLIC PANEL - Abnormal; Notable  for the following components:      Result Value   Glucose, Bld 138 (*)    All other components within normal limits  CBC WITH DIFFERENTIAL/PLATELET  URINALYSIS, ROUTINE W REFLEX MICROSCOPIC    EKG None  Radiology No results found.  Procedures Procedures    Medications Ordered in ED Medications  ketorolac (TORADOL) 15 MG/ML injection 15 mg (15 mg Intravenous Given 06/30/22 0516)  sodium chloride 0.9 % bolus 500 mL (0 mLs Intravenous Stopped 06/30/22 0629)  HYDROmorphone (DILAUDID) injection 1 mg (1 mg Intravenous Given 06/30/22 0547)  ondansetron (ZOFRAN) injection 4 mg (4 mg Intravenous Given 06/30/22 0546)    ED Course/ Medical Decision Making/ A&P                           Medical Decision Making Amount and/or Complexity of Data Reviewed Labs: ordered.  Risk Prescription drug  management.   Patient with history of kidney stones here for evaluation of right lower quadrant abdominal pain similar to prior kidney stones.  Patient in distress at time of ED presentation with diaphoresis, uncomfortable appearing.  He was treated with ketorolac as well as hydromorphone with significant improvement in his pain.  On repeat assessment patient's pain is well-controlled.  BMP with normal renal function, CBC without significant abnormality.  Patient has experienced prior similar symptoms related to kidney stones that did not have any complications or need surgical intervention.  Do not feel the patient requires repeat CT scan at this time given his symptoms are well-controlled.  Patient care transferred pending urinalysis.  If no evidence of infection anticipate patient can be discharged home with outpatient urology follow-up as well as return precautions.        Final Clinical Impression(s) / ED Diagnoses Final diagnoses:  Renal colic on right side    Rx / DC Orders ED Discharge Orders          Ordered    oxyCODONE-acetaminophen (PERCOCET/ROXICET) 5-325 MG tablet  Every 6 hours PRN        06/30/22 0637    tamsulosin (FLOMAX) 0.4 MG CAPS capsule  Daily        06/30/22 0637              Tilden Fossa, MD 06/30/22 [phone]

## 2022-06-30 NOTE — ED Notes (Signed)
Pt appears more relaxed -- no facial grimacing noted at this time.  Pt denies nausea and reports pain level now 1/10.  500cc bolus continues to infuse without complication.  UA still needed Pt denies any addl needs at this time - will monitor for acute changes and maintain plan of care.

## 2022-06-30 NOTE — ED Notes (Signed)
Reviewed discharge instructions , recommendations and medications with pt. Pt states understanding. Pt comfortable and ambulatory at time of discharge with family

## 2022-06-30 NOTE — ED Notes (Signed)
Pt continues to guard abdomen with facial grimacing observed -- Dr. Madilyn Hook sent secure chat requesting addl pain med and nausea med; 1mg  IVP Dilaudid and 4mg  IVP Zofran have been ordered (see MAR for med administration)

## 2022-06-30 NOTE — ED Triage Notes (Signed)
Pt states think having kidney stone. Has Hx of same some pain last night but worse an hour ago.

## 2023-04-19 ENCOUNTER — Ambulatory Visit (INDEPENDENT_AMBULATORY_CARE_PROVIDER_SITE_OTHER): Payer: No Typology Code available for payment source | Admitting: Family

## 2023-04-19 VITALS — BP 130/72 | HR 87 | Temp 98.7°F | Resp 16 | Wt 241.0 lb

## 2023-04-19 DIAGNOSIS — H6691 Otitis media, unspecified, right ear: Secondary | ICD-10-CM

## 2023-04-19 LAB — POCT RAPID STREP A (OFFICE): Rapid Strep A Screen: NEGATIVE

## 2023-04-19 MED ORDER — AMOXICILLIN 875 MG PO TABS: 875.0000 mg | ORAL_TABLET | Freq: Two times a day (BID) | ORAL | 0 refills | Status: AC

## 2023-04-19 NOTE — Progress Notes (Unsigned)
Subjective:     Patient ID: Warren Cole Age, male    DOB: 06-17-2001, 22 y.o.   MRN: [id number]  Chief Complaint  Patient presents with   Sore Throat    Complains of sore throat that started Friday   Cough    Complains of cough    Sore Throat  Associated symptoms include coughing.  Cough    Discussed the use of AI scribe software for clinical note transcription with the patient, who gave verbal consent to proceed.  22 year old male presents to the clinic today for sore throat and cough. He states that his symptoms started Friday.   He states that his symptoms are sore throat, and a cough that is productive,   He also states that for the past month he is having a really bad taste in the back of throat.  No fever noted  Went to a walk in clinic on Sunday but they did not test him for anything that told him he just had abdominal pain that should subside.    NO OTC medications taken.         Health Maintenance Due  Topic Date Due   HIV Screening  Never done   Hepatitis C Screening  Never done   INFLUENZA VACCINE  03/03/2023   COVID-19 Vaccine (1 - 2023-24 season) Never done    Past Medical History:  Diagnosis Date   Anxiety    Asthma    Depression    History of kidney stones     Past Surgical History:  Procedure Laterality Date   NO PAST SURGERIES      Family History  Problem Relation Age of Onset   Depression Mother    Depression Sister    Depression Brother    Cancer Maternal Grandmother    Heart disease Maternal Grandmother    Cancer Maternal Grandfather    Hyperlipidemia Maternal Grandfather    Hypertension Maternal Grandfather    Cancer Paternal Grandmother    Alcohol abuse Paternal Grandmother    Cancer Paternal Grandfather    Alcohol abuse Paternal Grandfather     Social History   Socioeconomic History   Marital status: Single    Spouse name: Not on file   Number of children: Not on file   Years of education: Not on file    Highest education level: Not on file  Occupational History   Not on file  Tobacco Use   Smoking status: Never   Smokeless tobacco: Never  Vaping Use   Vaping status: Former  Substance and Sexual Activity   Alcohol use: Yes    Alcohol/week: 8.0 standard drinks of alcohol    Types: 8 Cans of beer per week   Drug use: Yes    Types: Marijuana   Sexual activity: Yes    Comment: partner has Nexplanon  Other Topics Concern   Not on file  Social History Narrative   Not on file   Social Determinants of Health   Financial Resource Strain: Not on file  Food Insecurity: Not on file  Transportation Needs: Not on file  Physical Activity: Not on file  Stress: Not on file  Social Connections: Not on file  Intimate Partner Violence: Not on file    Outpatient Medications Prior to Visit  Medication Sig Dispense Refill   ALPRAZolam (XANAX) 1 MG tablet Take 1 mg by mouth daily as needed.     CAPLYTA 42 MG capsule Take 42 mg by mouth daily.  lamoTRIgine (LAMICTAL) 200 MG tablet Take 200 mg by mouth at bedtime.     sertraline (ZOLOFT) 100 MG tablet Take 100 mg by mouth daily.     ondansetron (ZOFRAN) 4 MG tablet Take 1 tablet (4 mg total) by mouth every 6 (six) hours. 12 tablet 0   oxyCODONE-acetaminophen (PERCOCET/ROXICET) 5-325 MG tablet Take 1 tablet by mouth every 6 (six) hours as needed for severe pain. 10 tablet 0   sertraline (ZOLOFT) 100 MG tablet Take 1 tablet (100 mg total) by mouth daily.     sertraline (ZOLOFT) 25 MG tablet Take 1 tablet (25 mg total) by mouth daily. Take with the 100 mg tabs to total 125 mg daily 90 tablet 1   tamsulosin (FLOMAX) 0.4 MG CAPS capsule Take 1 capsule (0.4 mg total) by mouth daily. 30 capsule 0   No facility-administered medications prior to visit.    No Known Allergies  Review of Systems  Respiratory:  Positive for cough.        Objective:    Physical Exam Constitutional:      Appearance: He is well-developed.  HENT:     Head:  Normocephalic.     Nose: Congestion present.     Mouth/Throat:     Pharynx: Pharyngeal swelling, oropharyngeal exudate and posterior oropharyngeal erythema present.     Tonsils: Tonsillar exudate present. 1+ on the right. 1+ on the left.  Neck:     Comments: Tenderness noted to right side of thyroid  Cardiovascular:     Rate and Rhythm: Normal rate and regular rhythm.     Heart sounds: Normal heart sounds.  Pulmonary:     Breath sounds: Rhonchi (lower left and right lungs) present.  Skin:    General: Skin is warm.     Capillary Refill: Capillary refill takes less than 2 seconds.  Neurological:     General: No focal deficit present.     Mental Status: He is alert and oriented to person, place, and time.  Psychiatric:        Mood and Affect: Mood normal.        Behavior: Behavior normal.      BP 130/72 (BP Location: Right Arm, Patient Position: Sitting, Cuff Size: Large)   Pulse 87   Temp 98.7 F (37.1 C) (Oral)   Resp 16   Wt 241 lb (109.3 kg)   SpO2 99%   BMI 29.34 kg/m  Wt Readings from Last 3 Encounters:  04/19/23 241 lb (109.3 kg)  06/30/22 250 lb (113.4 kg)  12/18/21 245 lb 12.8 oz (111.5 kg)       Assessment & Plan:   Problem List Items Addressed This Visit   None   I have discontinued Jontrell P. Thedford's ondansetron, oxyCODONE-acetaminophen, and tamsulosin. I am also having him maintain his sertraline, lamoTRIgine, Caplyta, and ALPRAZolam.  No orders of the defined types were placed in this encounter.

## 2023-04-20 DIAGNOSIS — H6691 Otitis media, unspecified, right ear: Secondary | ICD-10-CM | POA: Insufficient documentation

## 2023-04-20 NOTE — Progress Notes (Addendum)
Subjective:     Patient ID: Warren Cole Age, male    DOB: October 19, 2000, 22 y.o.   MRN: [id number]  Chief Complaint  Patient presents with   Sore Throat    Complains of sore throat that started Friday   Cough    Complains of cough    Sore Throat  Associated symptoms include coughing.  Cough    Discussed the use of AI scribe software for clinical note transcription with the patient, who gave verbal consent to proceed.  History of Present Illness   The patient presents with a sore throat and cough that started on Friday and has progressively worsened since Monday. He denies fever but has felt warm. He also reports a negative at-home COVID-19 test. The patient has noticed tenderness in the throat and right-sided cervical area. He has not reported any other symptoms.          Health Maintenance Due  Topic Date Due   HIV Screening  Never done   Hepatitis C Screening  Never done   INFLUENZA VACCINE  03/03/2023   COVID-19 Vaccine (1 - 2023-24 season) Never done    Past Medical History:  Diagnosis Date   Anxiety    Asthma    Depression    History of kidney stones     Past Surgical History:  Procedure Laterality Date   NO PAST SURGERIES      Family History  Problem Relation Age of Onset   Depression Mother    Depression Sister    Depression Brother    Cancer Maternal Grandmother    Heart disease Maternal Grandmother    Cancer Maternal Grandfather    Hyperlipidemia Maternal Grandfather    Hypertension Maternal Grandfather    Cancer Paternal Grandmother    Alcohol abuse Paternal Grandmother    Cancer Paternal Grandfather    Alcohol abuse Paternal Grandfather     Social History   Socioeconomic History   Marital status: Single    Spouse name: Not on file   Number of children: Not on file   Years of education: Not on file   Highest education level: Not on file  Occupational History   Not on file  Tobacco Use   Smoking status: Never   Smokeless  tobacco: Never  Vaping Use   Vaping status: Former  Substance and Sexual Activity   Alcohol use: Yes    Alcohol/week: 8.0 standard drinks of alcohol    Types: 8 Cans of beer per week   Drug use: Yes    Types: Marijuana   Sexual activity: Yes    Comment: partner has Nexplanon  Other Topics Concern   Not on file  Social History Narrative   Not on file   Social Determinants of Health   Financial Resource Strain: Not on file  Food Insecurity: Not on file  Transportation Needs: Not on file  Physical Activity: Not on file  Stress: Not on file  Social Connections: Not on file  Intimate Partner Violence: Not on file    Outpatient Medications Prior to Visit  Medication Sig Dispense Refill   ALPRAZolam (XANAX) 1 MG tablet Take 1 mg by mouth daily as needed.     CAPLYTA 42 MG capsule Take 42 mg by mouth daily.     lamoTRIgine (LAMICTAL) 200 MG tablet Take 200 mg by mouth at bedtime.     sertraline (ZOLOFT) 100 MG tablet Take 100 mg by mouth daily.     ondansetron (ZOFRAN) 4 MG tablet  Take 1 tablet (4 mg total) by mouth every 6 (six) hours. 12 tablet 0   oxyCODONE-acetaminophen (PERCOCET/ROXICET) 5-325 MG tablet Take 1 tablet by mouth every 6 (six) hours as needed for severe pain. 10 tablet 0   sertraline (ZOLOFT) 100 MG tablet Take 1 tablet (100 mg total) by mouth daily.     sertraline (ZOLOFT) 25 MG tablet Take 1 tablet (25 mg total) by mouth daily. Take with the 100 mg tabs to total 125 mg daily 90 tablet 1   tamsulosin (FLOMAX) 0.4 MG CAPS capsule Take 1 capsule (0.4 mg total) by mouth daily. 30 capsule 0   No facility-administered medications prior to visit.    No Known Allergies  Review of Systems  Respiratory:  Positive for cough.        Objective:    Physical Exam Constitutional:      General: He is not in acute distress.    Appearance: He is well-developed.  HENT:     Head: Normocephalic and atraumatic.     Right Ear: Ear canal normal. Tympanic membrane is  erythematous (mild).     Left Ear: Tympanic membrane and ear canal normal.     Mouth/Throat:     Pharynx: Oropharyngeal exudate and posterior oropharyngeal erythema present. No uvula swelling.     Tonsils: No tonsillar exudate or tonsillar abscesses.  Cardiovascular:     Rate and Rhythm: Normal rate and regular rhythm.     Heart sounds: No murmur heard. Pulmonary:     Effort: Pulmonary effort is normal. No respiratory distress.     Breath sounds: Normal breath sounds. No wheezing or rales.  Lymphadenopathy:     Cervical: Cervical adenopathy present.  Skin:    General: Skin is warm and dry.  Neurological:     Mental Status: He is alert and oriented to person, place, and time.  Psychiatric:        Behavior: Behavior normal.        Thought Content: Thought content normal.      BP 130/72 (BP Location: Right Arm, Patient Position: Sitting, Cuff Size: Large)   Pulse 87   Temp 98.7 F (37.1 C) (Oral)   Resp 16   Wt 241 lb (109.3 kg)   SpO2 99%   BMI 29.34 kg/m  Wt Readings from Last 3 Encounters:  04/19/23 241 lb (109.3 kg)  06/30/22 250 lb (113.4 kg)  12/18/21 245 lb 12.8 oz (111.5 kg)       Assessment & Plan:   Problem List Items Addressed This Visit       Unprioritized   Right otitis media - Primary    New. Early R OM in the setting of acute sore throat.  Rapid strep is negative but I am still suspicious that he may have strep. Will rx with amoxicillin for R OM and empiric coverage for strep.      Relevant Medications   amoxicillin (AMOXIL) 875 MG tablet    I have discontinued Ankit P. Westergaard's ondansetron, oxyCODONE-acetaminophen, and tamsulosin. I am also having him start on amoxicillin. Additionally, I am having him maintain his sertraline, lamoTRIgine, Caplyta, and ALPRAZolam.  Meds ordered this encounter  Medications   amoxicillin (AMOXIL) 875 MG tablet    Sig: Take 1 tablet (875 mg total) by mouth 2 (two) times daily for 10 days.    Dispense:  20 tablet     Refill:  0    Order Specific Question:   Supervising Provider    Answer:  BLYTH, STACEY A [[id number]]

## 2023-04-20 NOTE — Assessment & Plan Note (Signed)
New. Early R OM in the setting of acute sore throat.  Rapid strep is negative but I am still suspicious that he may have strep. Will rx with amoxicillin for R OM and empiric coverage for strep.

## 2023-04-20 NOTE — Patient Instructions (Signed)
VISIT SUMMARY:  During your visit, we discussed your symptoms of a sore throat and cough that started last Friday and has worsened since Monday. You also reported tenderness in your throat and the right side of your neck. We conducted a physical examination and noted a red throat, a clear sounding lung, a pink right eardrum, and swelling in the right side of your neck. We are awaiting the results of a strep test.  YOUR PLAN:  -PHARYNGITIS: Pharyngitis is inflammation of the throat. Your strep test was negative, but we are going to treat you anyway based on your symptoms and your right ear infection.  -COUGH: Your lungs sound clear, so we will monitor your cough and manage it symptomatically, meaning we will treat the symptoms as they occur.  -EARLY OTITIS MEDIA: Early Otitis Media is an early stage of an ear infection. We will treat it as necessary based on your symptoms and our clinical judgement.  -CERVICAL LYMPHADENOPATHY: Cervical Lymphadenopathy is swelling of the lymph nodes in the neck. It is likely related to your throat and ear conditions, so we will manage it as per the primary condition.  INSTRUCTIONS:  Please monitor your symptoms and contact us if they worsen or if you develop new symptoms. We will contact you with the results of your strep test and discuss next steps for treatment.

## 2023-04-25 ENCOUNTER — Encounter: Payer: Self-pay | Admitting: Family Medicine

## 2023-04-25 ENCOUNTER — Ambulatory Visit (INDEPENDENT_AMBULATORY_CARE_PROVIDER_SITE_OTHER): Payer: No Typology Code available for payment source | Admitting: Family Medicine

## 2023-04-25 VITALS — BP 130/83 | HR 82 | Ht 76.0 in | Wt 245.0 lb

## 2023-04-25 DIAGNOSIS — Z114 Encounter for screening for human immunodeficiency virus [HIV]: Secondary | ICD-10-CM

## 2023-04-25 DIAGNOSIS — Z0000 Encounter for general adult medical examination without abnormal findings: Secondary | ICD-10-CM

## 2023-04-25 DIAGNOSIS — Z1159 Encounter for screening for other viral diseases: Secondary | ICD-10-CM | POA: Diagnosis not present

## 2023-04-25 DIAGNOSIS — Z23 Encounter for immunization: Secondary | ICD-10-CM

## 2023-04-25 LAB — CBC WITH DIFFERENTIAL/PLATELET
Basophils Absolute: 0.1 10*3/uL (ref 0.0–0.1)
Basophils Relative: 0.9 % (ref 0.0–3.0)
Eosinophils Absolute: 0.1 10*3/uL (ref 0.0–0.7)
Eosinophils Relative: 0.8 % (ref 0.0–5.0)
HCT: 45.4 % (ref 39.0–52.0)
Hemoglobin: 15.3 g/dL (ref 13.0–17.0)
Lymphocytes Relative: 28.9 % (ref 12.0–46.0)
Lymphs Abs: 1.9 10*3/uL (ref 0.7–4.0)
MCHC: 33.7 g/dL (ref 30.0–36.0)
MCV: 89.9 fl (ref 78.0–100.0)
Monocytes Absolute: 0.5 10*3/uL (ref 0.1–1.0)
Monocytes Relative: 7.7 % (ref 3.0–12.0)
Neutro Abs: 4 10*3/uL (ref 1.4–7.7)
Neutrophils Relative %: 61.7 % (ref 43.0–77.0)
Platelets: 298 10*3/uL (ref 150.0–400.0)
RBC: 5.05 Mil/uL (ref 4.22–5.81)
RDW: 12.6 % (ref 11.5–15.5)
WBC: 6.5 10*3/uL (ref 4.0–10.5)

## 2023-04-25 LAB — COMPREHENSIVE METABOLIC PANEL
ALT: 16 U/L (ref 0–53)
AST: 17 U/L (ref 0–37)
Albumin: 4.3 g/dL (ref 3.5–5.2)
Alkaline Phosphatase: 84 U/L (ref 39–117)
BUN: 8 mg/dL (ref 6–23)
CO2: 24 mEq/L (ref 19–32)
Calcium: 9.6 mg/dL (ref 8.4–10.5)
Chloride: 107 mEq/L (ref 96–112)
Creatinine, Ser: 0.84 mg/dL (ref 0.40–1.50)
GFR: 122.8 mL/min (ref >60.00)
Glucose, Bld: 80 mg/dL (ref 70–99)
Potassium: 4.1 mEq/L (ref 3.5–5.1)
Sodium: 142 mEq/L (ref 135–145)
Total Bilirubin: 0.5 mg/dL (ref 0.2–1.2)
Total Protein: 7 g/dL (ref 6.0–8.3)

## 2023-04-25 LAB — LIPID PANEL
Cholesterol: 129 mg/dL (ref 0–200)
HDL: 27 mg/dL — ABNORMAL LOW (ref >39.00)
LDL Cholesterol: 69 mg/dL (ref 0–99)
NonHDL: 102.48
Total CHOL/HDL Ratio: 5
Triglycerides: 167 mg/dL — ABNORMAL HIGH (ref 0.0–149.0)
VLDL: 33.4 mg/dL (ref 0.0–40.0)

## 2023-04-25 LAB — TSH: TSH: 1.14 u[IU]/mL (ref 0.35–5.50)

## 2023-04-25 NOTE — Addendum Note (Signed)
Addended by: Wilford Corner on: 04/25/2023 02:34 PM   Modules accepted: Orders

## 2023-04-25 NOTE — Progress Notes (Signed)
Complete physical exam  Patient: Warren Cole   DOB: Jan 20, 2001   22 y.o. Male  MRN: [id number]  Subjective:    Chief Complaint  Patient presents with   Annual Exam    Volney Buter is a 22 y.o. male who presents today for a complete physical exam. He reports consuming a general diet. The patient does not participate in regular exercise at present. He generally feels well. He reports sleeping well. He does not have additional problems to discuss today.   Currently lives with: girlfriend Acute concerns or interim problems since last visit: no  Vision concerns: no concerns Dental concerns: no concerns STD concerns: no concerns  Patient endorses ETOH use. - 6 beers per week Patient endorses nicotine use. - pouches daily Patient endorses illegal substance use. - marijuana     Most recent fall risk assessment:    04/25/2023   10:42 AM  Fall Risk   Falls in the past year? 0  Number falls in past yr: 0  Injury with Fall? 0  Risk for fall due to : No Fall Risks  Follow up Falls evaluation completed     Most recent depression screenings:    04/25/2023   10:42 AM 12/18/2021    8:41 AM  PHQ 2/9 Scores  PHQ - 2 Score 1 3  PHQ- 9 Score  13           Patient Care Team: Clayborne Dana, NP as PCP - General (Family Medicine)   Outpatient Medications Prior to Visit  Medication Sig   ALPRAZolam (XANAX) 1 MG tablet Take 1 mg by mouth daily as needed.   amoxicillin (AMOXIL) 875 MG tablet Take 1 tablet (875 mg total) by mouth 2 (two) times daily for 10 days.   CAPLYTA 42 MG capsule Take 42 mg by mouth daily.   lamoTRIgine (LAMICTAL) 200 MG tablet Take 200 mg by mouth at bedtime.   sertraline (ZOLOFT) 100 MG tablet Take 100 mg by mouth daily.   No facility-administered medications prior to visit.    ROS All review of systems negative except what is listed in the HPI        Objective:     BP 130/83   Pulse 82   Ht 6\' 4"  (1.93 m)   Wt 245 lb  (111.1 kg)   SpO2 100%   BMI 29.82 kg/m    Physical Exam Vitals reviewed.  Constitutional:      General: He is not in acute distress.    Appearance: Normal appearance. He is not ill-appearing.  HENT:     Head: Normocephalic and atraumatic.     Right Ear: Tympanic membrane normal.     Left Ear: Tympanic membrane normal.     Nose: Nose normal.     Mouth/Throat:     Mouth: Mucous membranes are moist.     Pharynx: Oropharynx is clear.  Eyes:     Extraocular Movements: Extraocular movements intact.     Conjunctiva/sclera: Conjunctivae normal.     Pupils: Pupils are equal, round, and reactive to light.  Neck:     Vascular: No carotid bruit.  Cardiovascular:     Rate and Rhythm: Normal rate and regular rhythm.     Pulses: Normal pulses.     Heart sounds: Normal heart sounds.  Pulmonary:     Effort: Pulmonary effort is normal.     Breath sounds: Normal breath sounds.  Abdominal:     General: Abdomen is flat. Bowel  sounds are normal. There is no distension.     Palpations: Abdomen is soft. There is no mass.     Tenderness: There is no abdominal tenderness. There is no right CVA tenderness, left CVA tenderness, guarding or rebound.     Comments: Left mid abdomen with palpable lesion <2cm, ?lipoma, not painful   Genitourinary:    Comments: Deferred exam Musculoskeletal:        General: Normal range of motion.     Cervical back: Normal range of motion and neck supple. No tenderness.     Right lower leg: No edema.     Left lower leg: No edema.  Lymphadenopathy:     Cervical: No cervical adenopathy.  Skin:    General: Skin is warm and dry.     Capillary Refill: Capillary refill takes less than 2 seconds.  Neurological:     General: No focal deficit present.     Mental Status: He is alert and oriented to person, place, and time. Mental status is at baseline.  Psychiatric:        Mood and Affect: Mood normal.        Behavior: Behavior normal.        Thought Content: Thought  content normal.        Judgment: Judgment normal.      No results found for any visits on 04/25/23.     Assessment & Plan:    Routine Health Maintenance and Physical Exam Discussed health promotion and safety including diet and exercise recommendations, dental health, and injury prevention. Tobacco cessation if applicable. Seat belts, sunscreen, smoke detectors, etc.    Immunization History  Administered Date(s) Administered   DTaP 11/03/1999, 01/04/2000, 03/07/2000, 02/06/2001, 03/05/2004   HIB (PRP-OMP) 11/03/1999, 01/04/2000, 03/07/2000, 02/06/2001   HPV Quadrivalent 05/31/2012, 02/16/2013, 03/03/2015   Hepatitis B 03/21/00, 10/08/1999, 06/03/2000   IPV 11/03/1999, 01/04/2000, 02/06/2001, 03/05/2004   Influenza, Mdck, Trivalent,PF 6+ MOS(egg free) 05/03/2018   Influenza, Seasonal, Injecte, Preservative Fre 04/25/2023   Janssen (J&J) SARS-COV-2 Vaccination 10/19/2019   MMR 02/06/2001, 03/05/2004   MenQuadfi_Meningococcal Groups ACYW Conjugate 12/18/2009   Pneumococcal Conjugate-13 11/03/1999, 01/04/2000, 03/07/2000, 10/24/2000   Tdap 12/18/2009, 10/06/2021    Health Maintenance  Topic Date Due   HIV Screening  Never done   Hepatitis C Screening  Never done   COVID-19 Vaccine (2 - 2023-24 season) 04/21/2024 (Originally 04/03/2023)   DTaP/Tdap/Td (8 - Td or Tdap) 10/07/2031   INFLUENZA VACCINE  Completed   HPV VACCINES  Completed        Problem List Items Addressed This Visit   None Visit Diagnoses     Annual physical exam    -  Primary Palpable area <2cm to left abdomen, consider lipoma. Patient declines workup today. Not bothersome and present for as long as he can remember. Patient aware of signs/symptoms requiring further/urgent evaluation.    Relevant Orders   CBC with Differential/Platelet   Comprehensive metabolic panel   Lipid panel   TSH   Encounter for screening for HIV       Relevant Orders   HIV Antibody (routine testing w rflx)   Encounter for  hepatitis C screening test for low risk patient       Relevant Orders   Hepatitis C antibody      Return in about 1 year (around 04/24/2024) for physical.     Clayborne Dana, NP

## 2023-04-26 LAB — HIV ANTIBODY (ROUTINE TESTING W REFLEX): HIV 1&2 Ab, 4th Generation: NONREACTIVE

## 2023-04-26 LAB — HEPATITIS C ANTIBODY: Hepatitis C Ab: NONREACTIVE

## 2023-04-26 NOTE — Progress Notes (Signed)
Triglycerides are a little high, but not fasting can affect this. Recommend healthy lifestyle - heart healthy diet, regular exercise, fish oil/Omega-3 supplement. This will also help improve your HDL (good cholesterol).  Other labs are normal!

## 2023-05-19 ENCOUNTER — Encounter: Payer: Self-pay | Admitting: Family Medicine

## 2023-05-19 DIAGNOSIS — R112 Nausea with vomiting, unspecified: Secondary | ICD-10-CM

## 2023-05-19 MED ORDER — ONDANSETRON HCL 4 MG PO TABS: 4.0000 mg | ORAL_TABLET | Freq: Three times a day (TID) | ORAL | 0 refills | Status: AC | PRN

## 2023-05-19 NOTE — Telephone Encounter (Signed)
Don't see anything about this in his last note?

## 2023-05-20 ENCOUNTER — Encounter: Payer: Self-pay | Admitting: Family Medicine

## 2023-05-20 ENCOUNTER — Ambulatory Visit: Payer: No Typology Code available for payment source | Admitting: Family Medicine

## 2023-05-20 VITALS — BP 117/78 | HR 79 | Temp 98.2°F | Resp 18 | Ht 75.0 in | Wt 240.0 lb

## 2023-05-20 DIAGNOSIS — R197 Diarrhea, unspecified: Secondary | ICD-10-CM | POA: Diagnosis not present

## 2023-05-20 DIAGNOSIS — R630 Anorexia: Secondary | ICD-10-CM

## 2023-05-20 DIAGNOSIS — R112 Nausea with vomiting, unspecified: Secondary | ICD-10-CM | POA: Diagnosis not present

## 2023-05-20 DIAGNOSIS — K920 Hematemesis: Secondary | ICD-10-CM

## 2023-05-20 LAB — COMPREHENSIVE METABOLIC PANEL
ALT: 20 U/L (ref 0–53)
AST: 20 U/L (ref 0–37)
Albumin: 4.5 g/dL (ref 3.5–5.2)
Alkaline Phosphatase: 80 U/L (ref 39–117)
BUN: 9 mg/dL (ref 6–23)
CO2: 31 meq/L (ref 19–32)
Calcium: 9.9 mg/dL (ref 8.4–10.5)
Chloride: 104 meq/L (ref 96–112)
Creatinine, Ser: 0.86 mg/dL (ref 0.40–1.50)
GFR: 121.88 mL/min (ref >60.00)
Glucose, Bld: 85 mg/dL (ref 70–99)
Potassium: 4.6 meq/L (ref 3.5–5.1)
Sodium: 141 meq/L (ref 135–145)
Total Bilirubin: 1.3 mg/dL — ABNORMAL HIGH (ref 0.2–1.2)
Total Protein: 7.2 g/dL (ref 6.0–8.3)

## 2023-05-20 LAB — CBC WITH DIFFERENTIAL/PLATELET
Basophils Absolute: 0 10*3/uL (ref 0.0–0.1)
Basophils Relative: 0.8 % (ref 0.0–3.0)
Eosinophils Absolute: 0 10*3/uL (ref 0.0–0.7)
Eosinophils Relative: 0.1 % (ref 0.0–5.0)
HCT: 47.5 % (ref 39.0–52.0)
Hemoglobin: 15.9 g/dL (ref 13.0–17.0)
Lymphocytes Relative: 35.3 % (ref 12.0–46.0)
Lymphs Abs: 1.7 10*3/uL (ref 0.7–4.0)
MCHC: 33.4 g/dL (ref 30.0–36.0)
MCV: 91.3 fL (ref 78.0–100.0)
Monocytes Absolute: 0.4 10*3/uL (ref 0.1–1.0)
Monocytes Relative: 8.7 % (ref 3.0–12.0)
Neutro Abs: 2.6 10*3/uL (ref 1.4–7.7)
Neutrophils Relative %: 55.1 % (ref 43.0–77.0)
Platelets: 286 10*3/uL (ref 150.0–400.0)
RBC: 5.21 Mil/uL (ref 4.22–5.81)
RDW: 12.9 % (ref 11.5–15.5)
WBC: 4.7 10*3/uL (ref 4.0–10.5)

## 2023-05-20 LAB — LIPASE: Lipase: 38 U/L (ref 11.0–59.0)

## 2023-05-20 NOTE — Progress Notes (Signed)
Acute Office Visit  Subjective:     Patient ID: Warren Cole, male    DOB: Jul 22, 2001, 22 y.o.   MRN: [id number]  Chief Complaint  Patient presents with   Nausea    HPI Patient is in today for abdominal concerns.   Discussed the use of AI scribe software for clinical note transcription with the patient, who gave verbal consent to proceed.  History of Present Illness   The patient, with a history of mental health issues, presents with a month and a half of gastrointestinal symptoms. The symptoms began abruptly, with no identifiable trigger. The patient experiences nausea most days, leading to episodes of vomiting approximately one to two times per week. The vomiting episodes are sporadic, with no discernible pattern or specific triggers. Temporary relief is experienced post-vomiting, but the nausea typically returns.  The patient also reports intermittent diarrhea, which is described as watery. The frequency and timing of these episodes are unpredictable, with some days presenting normal bowel movements. The patient denies any abdominal cramping prior to these episodes. Minimal blood has been noticed on wiping, but not mixed in the stool.  The patient has also noticed traces of blood in their spit after vomiting. The patient denies any abdominal pain.  The patient's appetite has been significantly reduced for the past month, leading to unintentional weight loss of five pounds over the past two to three weeks. The patient denies any changes to diet, activity, or stress levels, apart from the reduced food intake due to poor appetite. The patient occasionally experiences headaches, but these do not consistently correlate with the nausea.  The patient denies any bloating or feelings of fullness in the abdomen. The patient admits to occasional marijuana use, but this has decreased recently due to the ongoing gastrointestinal symptoms. The patient denies any recent alcohol consumption.             Wt Readings from Last 3 Encounters:  05/20/23 240 lb (108.9 kg)  04/25/23 245 lb (111.1 kg)  04/19/23 241 lb (109.3 kg)      ROS All review of systems negative except what is listed in the HPI      Objective:    BP 117/78 (BP Location: Right Arm, Patient Position: Sitting, Cuff Size: Large)   Pulse 79   Temp 98.2 F (36.8 C) (Oral)   Resp 18   Ht 6\' 3"  (1.905 m)   Wt 240 lb (108.9 kg)   SpO2 96%   BMI 30.00 kg/m    Physical Exam Vitals reviewed.  Constitutional:      Appearance: Normal appearance.  Cardiovascular:     Rate and Rhythm: Normal rate and regular rhythm.     Heart sounds: Normal heart sounds.  Pulmonary:     Effort: Pulmonary effort is normal.     Breath sounds: Normal breath sounds.  Abdominal:     General: Bowel sounds are normal. There is no distension.     Palpations: Abdomen is soft. There is no mass.     Tenderness: There is no abdominal tenderness. There is no guarding or rebound.  Skin:    General: Skin is warm and dry.  Neurological:     Mental Status: He is alert and oriented to person, place, and time.  Psychiatric:        Mood and Affect: Mood normal.        Behavior: Behavior normal.        Thought Content: Thought content normal.  Judgment: Judgment normal.     No results found for any visits on 05/20/23.      Assessment & Plan:   Problem List Items Addressed This Visit   None Visit Diagnoses     Nausea vomiting and diarrhea    -  Primary   Relevant Orders   CBC with Differential/Platelet   Comprehensive metabolic panel   Lipase   CT ABDOMEN PELVIS WO CONTRAST   Decreased appetite       Relevant Orders   CBC with Differential/Platelet   Comprehensive metabolic panel   Lipase   CT ABDOMEN PELVIS WO CONTRAST   Diarrhea, unspecified type       Relevant Orders   CT ABDOMEN PELVIS WO CONTRAST   Symptom of blood in vomit       Relevant Orders   CT ABDOMEN PELVIS WO CONTRAST      Ongoing  for 1.5 months with intermittent vomiting episodes 1-2 times per week. No clear triggers identified. He has noticed a few spots of blood after vomiting episodes. Associated with poor appetite and weight loss of 5 pounds. No abdominal pain. Loose, watery stools occurring intermittently with no clear pattern. No associated abdominal cramping. Minimal blood noted on wiping, likely due to excessive wiping. -Declined rectal exam today -We will try to get a CT Abd approved -Order blood work to check for any metabolic causes. -Prescribed Zofran for symptomatic relief of nausea. To be taken as needed. -GI appointment already scheduled for November. -Strict ED precautions discussed. -If diarrhea becomes more consistent, can sample     No orders of the defined types were placed in this encounter.   Return if symptoms worsen or fail to improve.  Clayborne Dana, NP

## 2023-05-23 NOTE — Addendum Note (Signed)
Addended by: Hyman Hopes B on: 05/23/2023 07:55 AM   Modules accepted: Orders

## 2023-05-26 ENCOUNTER — Ambulatory Visit (HOSPITAL_BASED_OUTPATIENT_CLINIC_OR_DEPARTMENT_OTHER): Payer: No Typology Code available for payment source

## 2023-06-23 NOTE — Progress Notes (Signed)
Celso Amy, PA-C [address]  [address]  [place], [place] [address]  Main: [phone]  Fax: [phone]   Gastroenterology Consultation  Referring Provider:     Clayborne Dana, NP Primary Care Physician:  Clayborne Dana, NP Primary Gastroenterologist:  Celso Amy, PA-C  Reason for Consultation:     Nausea, vomiting, diarrhea        HPI:   Warren Cole is a 22 y.o. y/o male referred for consultation & management  by Clayborne Dana, NP.  Referred to evaluate intermittent episodes of nausea, vomiting, and diarrhea for 10 months.  Patient states his symptoms have been better in the past 2 weeks.  He was recently started on Zofran which has helped.  He denies heartburn.  He was having 2 or 3 episodes of vomiting per week.  Has diarrhea alternating with constipation.  Irregular bowel habits.  He denies heartburn, dysphagia, melena, hematochezia, or weight loss.  No family history of GI problems.  Admits to daily marijuana use.  Rare alcohol 2-3 times per month.  Labs 05/20/2023: Normal CBC, CMP, lipase.  Hemoglobin 15.9.  Slightly elevated bilirubin 1.3, all other LFTs normal.  He was scheduled for complete abdominal ultrasound, not completed yet due to cost.  Last CT abdomen pelvis without contrast 10/2021 showed 2 mm right kidney stone.  No other acute abnormality.  No gallstones.  Past Medical History:  Diagnosis Date   Anxiety    Asthma    Depression    History of kidney stones     Past Surgical History:  Procedure Laterality Date   NO PAST SURGERIES      Prior to Admission medications   Medication Sig Start Date End Date Taking? Authorizing Provider  ALPRAZolam Prudy Feeler) 1 MG tablet Take 1 mg by mouth daily as needed. 04/17/23   [provider]  CAPLYTA 42 MG capsule Take 42 mg by mouth daily. 04/17/23   [provider]  fluvoxaMINE (LUVOX) 100 MG tablet Take 100 mg by mouth 2 (two) times daily. 05/17/23   [provider]   lamoTRIgine (LAMICTAL) 200 MG tablet Take 200 mg by mouth at bedtime. 03/20/23   [provider]  ondansetron (ZOFRAN) 4 MG tablet Take 1 tablet (4 mg total) by mouth every 8 (eight) hours as needed for nausea or vomiting. Patient not taking: Reported on 05/20/2023 05/19/23   Clayborne Dana, NP    Family History  Problem Relation Age of Onset   Depression Mother    Depression Sister    Depression Brother    Cancer Maternal Grandmother    Heart disease Maternal Grandmother    Cancer Maternal Grandfather    Hyperlipidemia Maternal Grandfather    Hypertension Maternal Grandfather    Cancer Paternal Grandmother    Alcohol abuse Paternal Grandmother    Cancer Paternal Grandfather    Alcohol abuse Paternal Grandfather      Social History   Tobacco Use   Smoking status: Never   Smokeless tobacco: Never  Vaping Use   Vaping status: Former  Substance Use Topics   Alcohol use: Yes    Alcohol/week: 8.0 standard drinks of alcohol    Types: 8 Cans of beer per week   Drug use: Yes    Types: Marijuana    Allergies as of 06/24/2023   (No Known Allergies)    Review of Systems:    All systems reviewed and negative except where noted in HPI.   Physical Exam:  BP 111/72   Pulse 71   Temp 98 F (36.7 C)   Ht 6\' 3"  (1.905 m)   Wt 248 lb (112.5 kg)   BMI 31.00 kg/m  No LMP for male patient.  General:   Alert,  Well-developed, well-nourished, pleasant and cooperative in NAD Lungs:  Respirations even and unlabored.  Clear throughout to auscultation.   No wheezes, crackles, or rhonchi. No acute distress. Heart:  Regular rate and rhythm; no murmurs, clicks, rubs, or gallops. Abdomen:  Normal bowel sounds.  No bruits.  Soft, and non-distended without masses, hepatosplenomegaly or hernias noted.  No Tenderness.  No guarding or rebound tenderness.    Neurologic:  Alert and oriented x3;  grossly normal neurologically. Psych:  Alert and cooperative. Normal mood and  affect.  Imaging Studies: No results found.  Assessment and Plan:   Warren Cole is a 22 y.o. y/o male has been referred for intermittent episodes of nausea, vomiting, diarrhea, and constipation.  Symptoms are concerning for hyperemesis cannabis syndrome.  Differential also includes H. pylori, gastritis, peptic ulcer, irritable bowel syndrome.  1.  Nausea and vomiting 2.  Irregular bowel habits with diarrhea and constipation. 3.  Marijuana use 4.  Mildly elevated total bilirubin; suspect Gilbert's syndrome  Plan: -H. pylori breath test -Hepatic panel -Stop all marijuana use -Start omeprazole 20 Mg once daily. -Avoid GERD trigger foods and drinks, list given. -If symptoms worsen or persist, then abdominal ultrasound and EGD would be the next steps.  Follow up in 6 weeks to assess response to therapy.  Celso Amy, PA-C

## 2023-06-24 ENCOUNTER — Ambulatory Visit: Payer: No Typology Code available for payment source | Admitting: Physician Assistant

## 2023-06-24 ENCOUNTER — Encounter: Payer: Self-pay | Admitting: Physician Assistant

## 2023-06-24 VITALS — BP 111/72 | HR 71 | Temp 98.0°F | Ht 75.0 in | Wt 248.0 lb

## 2023-06-24 DIAGNOSIS — R197 Diarrhea, unspecified: Secondary | ICD-10-CM

## 2023-06-24 DIAGNOSIS — R17 Unspecified jaundice: Secondary | ICD-10-CM | POA: Diagnosis not present

## 2023-06-24 DIAGNOSIS — R112 Nausea with vomiting, unspecified: Secondary | ICD-10-CM | POA: Diagnosis not present

## 2023-06-24 DIAGNOSIS — K5900 Constipation, unspecified: Secondary | ICD-10-CM

## 2023-06-24 DIAGNOSIS — F1290 Cannabis use, unspecified, uncomplicated: Secondary | ICD-10-CM

## 2023-06-24 DIAGNOSIS — R198 Other specified symptoms and signs involving the digestive system and abdomen: Secondary | ICD-10-CM

## 2023-06-24 DIAGNOSIS — R1013 Epigastric pain: Secondary | ICD-10-CM

## 2023-06-24 MED ORDER — OMEPRAZOLE 20 MG PO CPDR
20.0000 mg | DELAYED_RELEASE_CAPSULE | Freq: Every day | ORAL | 2 refills | Status: AC
Start: 2023-06-24 — End: 2023-09-22

## 2023-06-26 LAB — HEPATIC FUNCTION PANEL
ALT: 21 IU/L (ref 0–44)
AST: 21 [IU]/L (ref 0–40)
Albumin: 4.3 g/dL (ref 4.3–5.2)
Alkaline Phosphatase: 104 IU/L (ref 44–121)
Bilirubin Total: 0.3 mg/dL (ref 0.0–1.2)
Bilirubin, Direct: 0.15 mg/dL (ref 0.00–0.40)
Total Protein: 6.9 g/dL (ref 6.0–8.5)

## 2023-06-26 LAB — H. PYLORI BREATH TEST: H pylori Breath Test: NEGATIVE

## 2023-08-05 ENCOUNTER — Ambulatory Visit: Payer: No Typology Code available for payment source | Admitting: Physician Assistant

## 2023-12-12 ENCOUNTER — Other Ambulatory Visit (HOSPITAL_BASED_OUTPATIENT_CLINIC_OR_DEPARTMENT_OTHER): Payer: Self-pay

## 2023-12-12 ENCOUNTER — Ambulatory Visit: Admitting: Family Medicine

## 2023-12-12 ENCOUNTER — Encounter: Payer: Self-pay | Admitting: Family Medicine

## 2023-12-12 VITALS — BP 130/87 | HR 90 | Ht 75.0 in | Wt 240.0 lb

## 2023-12-12 DIAGNOSIS — K600 Acute anal fissure: Secondary | ICD-10-CM | POA: Diagnosis not present

## 2023-12-12 MED ORDER — LIDOCAINE HCL 2 % EX GEL
1.0000 | CUTANEOUS | 1 refills | Status: AC | PRN
  Filled 2023-12-12: qty 30, 30d supply, fill #0

## 2023-12-12 MED ORDER — NITROGLYCERIN 0.4 % RE OINT
1.0000 [in_us] | TOPICAL_OINTMENT | Freq: Two times a day (BID) | RECTAL | 11 refills | Status: AC
  Filled 2023-12-12: qty 30, 30d supply, fill #0

## 2023-12-12 NOTE — Patient Instructions (Addendum)
 Management of anal fissures: Apply topical nitroglycerin to fissure twice daily to promote healing - monitor for potential headaches and low blood pressure. If needed, can add topical lidocaine jelly for pain. Sitz baths - available online or at most pharmacies. Prevent constipation - Daily stool softeners and fiber supplement. Stay well hydrated.

## 2023-12-12 NOTE — Progress Notes (Signed)
 Acute Office Visit  Subjective:     Patient ID: Warren Cole, male    DOB: 2000-09-24, 23 y.o.   MRN: [id number]  Chief Complaint  Patient presents with   Rectal Pain    HPI Patient is in today for recta pain.   Discussed the use of AI scribe software for clinical note transcription with the patient, who gave verbal consent to proceed.  History of Present Illness Warren Cole is a 23 year old male who presents with severe anal pain.  He has been experiencing severe anal pain that began on Saturday as a subtle discomfort and progressively worsened, becoming 'unimaginable' by Sunday. The pain is described as stabbing and located inside near his anus, posteriorly, making it difficult to walk straight and find a comfortable position for sleep. Sitting exacerbates the pain.  He has not noticed any rectal bleeding, itching, or anal intercourse. No fever, chills, nausea, vomiting, or diarrhea. He has a history of hemorrhoids but states that this pain feels different.  He mentions constipation, having not used the bathroom since Friday until his mother gave him a laxative on Sunday, which resulted in a small, painful bowel movement. He did not feel constipated or strain prior to this. No abdominal pain.        ROS All review of systems negative except what is listed in the HPI      Objective:    BP 130/87   Pulse 90   Ht 6\' 3"  (1.905 m)   Wt 240 lb (108.9 kg)   SpO2 98%   BMI 30.00 kg/m    Physical Exam Vitals reviewed. Exam conducted with a chaperone present.  Constitutional:      Appearance: Normal appearance.  Genitourinary:    Rectum: Tenderness and anal fissure present.       Comments: Small anal fissure, no abscess palpable, no visible/palpable hemorrhoids; internal exam limited due to discomfort Skin:    General: Skin is warm and dry.  Neurological:     Mental Status: He is alert and oriented to person, place, and time.  Psychiatric:         Mood and Affect: Mood normal.        Behavior: Behavior normal.        Thought Content: Thought content normal.        Judgment: Judgment normal.     No results found for any visits on 12/12/23.      Assessment & Plan:   Problem List Items Addressed This Visit   None Visit Diagnoses       Acute posterior anal fissure    -  Primary   Relevant Medications   Nitroglycerin 0.4 % OINT   lidocaine (XYLOCAINE) 2 % jelly     Apply topical nitroglycerin to fissure twice daily to promote healing - monitor for potential headaches and low blood pressure. If needed, can add topical lidocaine jelly for pain. Sitz baths - available online or at most pharmacies. Prevent constipation - Daily stool softeners and fiber supplement. Stay well hydrated.   Meds ordered this encounter  Medications   Nitroglycerin 0.4 % OINT    Sig: Place 1 inch of ointment rectally 2 (two) times daily.    Dispense:  30 g    Refill:  11    Supervising Provider:   Randie Bustle A [[id number]]   lidocaine (XYLOCAINE) 2 % jelly    Sig: Apply 1 Application topically as needed.    Dispense:  30  mL    Refill:  1    Supervising Provider:   Randie Bustle A [[id number]]    Return if symptoms worsen or fail to improve.  Everlina Hock, NP

## 2023-12-13 ENCOUNTER — Other Ambulatory Visit: Payer: Self-pay

## 2023-12-13 ENCOUNTER — Emergency Department (HOSPITAL_BASED_OUTPATIENT_CLINIC_OR_DEPARTMENT_OTHER)
Admission: EM | Admit: 2023-12-13 | Discharge: 2023-12-14 | Disposition: A | Discharge status: Home / Self Care | Attending: Emergency Medicine | Admitting: Emergency Medicine

## 2023-12-13 ENCOUNTER — Other Ambulatory Visit (HOSPITAL_BASED_OUTPATIENT_CLINIC_OR_DEPARTMENT_OTHER): Payer: Self-pay

## 2023-12-13 DIAGNOSIS — K6289 Other specified diseases of anus and rectum: Secondary | ICD-10-CM | POA: Diagnosis present

## 2023-12-13 LAB — COMPREHENSIVE METABOLIC PANEL WITH GFR
ALT: 18 U/L (ref 0–44)
AST: 19 U/L (ref 15–41)
Albumin: 4.2 g/dL (ref 3.5–5.0)
Alkaline Phosphatase: 108 U/L (ref 38–126)
Anion gap: 12 (ref 5–15)
BUN: 8 mg/dL (ref 6–20)
CO2: 25 mmol/L (ref 22–32)
Calcium: 9.8 mg/dL (ref 8.9–10.3)
Chloride: 102 mmol/L (ref 98–111)
Creatinine, Ser: 0.97 mg/dL (ref 0.61–1.24)
GFR, Estimated: >60 mL/min (ref >60)
Glucose, Bld: 99 mg/dL (ref 70–99)
Potassium: 4.3 mmol/L (ref 3.5–5.1)
Sodium: 139 mmol/L (ref 135–145)
Total Bilirubin: 0.8 mg/dL (ref 0.0–1.2)
Total Protein: 7.2 g/dL (ref 6.5–8.1)

## 2023-12-13 LAB — CBC WITH DIFFERENTIAL/PLATELET
Abs Immature Granulocytes: 0.02 10*3/uL (ref 0.00–0.07)
Basophils Absolute: 0 10*3/uL (ref 0.0–0.1)
Basophils Relative: 0 %
Eosinophils Absolute: 0 10*3/uL (ref 0.0–0.5)
Eosinophils Relative: 0 %
HCT: 43.3 % (ref 39.0–52.0)
Hemoglobin: 15.6 g/dL (ref 13.0–17.0)
Immature Granulocytes: 0 %
Lymphocytes Relative: 19 %
Lymphs Abs: 2.2 10*3/uL (ref 0.7–4.0)
MCH: 30.8 pg (ref 26.0–34.0)
MCHC: 36 g/dL (ref 30.0–36.0)
MCV: 85.4 fL (ref 80.0–100.0)
Monocytes Absolute: 0.9 10*3/uL (ref 0.1–1.0)
Monocytes Relative: 8 %
Neutro Abs: 8.3 10*3/uL — ABNORMAL HIGH (ref 1.7–7.7)
Neutrophils Relative %: 73 %
Platelets: 261 10*3/uL (ref 150–400)
RBC: 5.07 MIL/uL (ref 4.22–5.81)
RDW: 12.1 % (ref 11.5–15.5)
WBC: 11.4 10*3/uL — ABNORMAL HIGH (ref 4.0–10.5)
nRBC: 0 % (ref 0.0–0.2)

## 2023-12-13 MED ORDER — ONDANSETRON HCL 4 MG/2ML IJ SOLN
4.0000 mg | Freq: Once | INTRAMUSCULAR | Status: AC
  Administered 2023-12-13: 4 mg via INTRAVENOUS
  Filled 2023-12-13: qty 2

## 2023-12-13 MED ORDER — MORPHINE SULFATE (PF) 4 MG/ML IV SOLN
4.0000 mg | Freq: Once | INTRAVENOUS | Status: AC
  Administered 2023-12-13: 4 mg via INTRAVENOUS
  Filled 2023-12-13: qty 1

## 2023-12-13 NOTE — ED Provider Notes (Signed)
 [place] EMERGENCY DEPARTMENT AT [facility] Provider Note   CSN: [id number] Arrival date & time: 12/13/23  2032     History {Add pertinent medical, surgical, social history, OB history to HPI:1} Chief Complaint  Patient presents with   Rectal Pain    Warren Cole is a 23 y.o. male.  23 yo M with a cc of rectal pain.  Has been going on for about 3 to 4 days.  He denies trauma to the area.  He saw his primary care provider who told him that he had a rectal fissure he has been on nitroglycerin topical treatment as well as topical lidocaine.  Has had ongoing terrible pain and so came in for evaluation.  He denies any foreign bodies to the rectum.  Denies fevers.  Denies obvious injury.        Home Medications Prior to Admission medications   Medication Sig Start Date End Date Taking? Authorizing Provider  ALPRAZolam (XANAX) 1 MG tablet Take 1 mg by mouth daily as needed. 04/17/23   [provider]  CAPLYTA 42 MG capsule Take 42 mg by mouth daily. 04/17/23   [provider]  fluvoxaMINE (LUVOX) 100 MG tablet Take 100 mg by mouth 2 (two) times daily. 05/17/23   [provider]  lamoTRIgine (LAMICTAL) 200 MG tablet Take 200 mg by mouth at bedtime. 03/20/23   [provider]  lidocaine (XYLOCAINE) 2 % jelly Apply 1 Application topically as needed. 12/12/23   Everlina Hock, NP  Nitroglycerin 0.4 % OINT Place 1 inch of ointment rectally 2 (two) times daily. 12/12/23   Everlina Hock, NP  omeprazole  (PRILOSEC) 20 MG capsule Take 1 capsule (20 mg total) by mouth daily. 06/24/23 09/22/23  Brigitte Canard, PA-C  ondansetron  (ZOFRAN ) 4 MG tablet Take 1 tablet (4 mg total) by mouth every 8 (eight) hours as needed for nausea or vomiting. 05/19/23   Everlina Hock, NP      Allergies    Patient has no known allergies.    Review of Systems   Review of Systems  Physical Exam Updated Vital Signs BP (!) 129/91 (BP Location: Right Arm)   Pulse  92   Temp 98 F (36.7 C)   Resp 15   Ht 6\' 3"  (1.905 m)   Wt 108.9 kg   SpO2 100%   BMI 30.00 kg/m  Physical Exam Vitals and nursing note reviewed.  Constitutional:      Appearance: He is well-developed.  HENT:     Head: Normocephalic and atraumatic.  Eyes:     Pupils: Pupils are equal, round, and reactive to light.  Neck:     Vascular: No JVD.  Cardiovascular:     Rate and Rhythm: Normal rate and regular rhythm.     Heart sounds: No murmur heard.    No friction rub. No gallop.  Pulmonary:     Effort: No respiratory distress.     Breath sounds: No wheezing.  Abdominal:     General: There is no distension.     Tenderness: There is no abdominal tenderness. There is no guarding or rebound.  Genitourinary:    Comments: I do not appreciate any obvious hemorrhoids or fissures to the rectum.  I do not appreciate any obvious areas of erythema induration or fluctuance. Musculoskeletal:        General: Normal range of motion.     Cervical back: Normal range of motion and neck supple.  Skin:  Coloration: Skin is not pale.     Findings: No rash.  Neurological:     Mental Status: He is alert and oriented to person, place, and time.  Psychiatric:        Behavior: Behavior normal.     ED Results / Procedures / Treatments   Labs (all labs ordered are listed, but only abnormal results are displayed) Labs Reviewed  CBC WITH DIFFERENTIAL/PLATELET  COMPREHENSIVE METABOLIC PANEL WITH GFR    EKG None  Radiology No results found.  Procedures Procedures  {Document cardiac monitor, telemetry assessment procedure when appropriate:1}  Medications Ordered in ED Medications  morphine (PF) 4 MG/ML injection 4 mg (has no administration in time range)  ondansetron  (ZOFRAN ) injection 4 mg (has no administration in time range)    ED Course/ Medical Decision Making/ A&P   {   Click here for ABCD2, HEART and other calculatorsREFRESH Note before signing :1}                               Medical Decision Making Amount and/or Complexity of Data Reviewed Labs: ordered. Radiology: ordered.  Risk Prescription drug management.   23 yo M with a chief complaints of rectal pain.  Going on for about 3 to 4 days.  Patient has quite a bit more pain than I would expect.  I also do not see an obvious fissure.  I wonder if he has had some improvement since last check.  Will obtain blood work CT imaging treat pain.  Reassess  {Document critical care time when appropriate:1} {Document review of labs and clinical decision tools ie heart score, Chads2Vasc2 etc:1}  {Document your independent review of radiology images, and any outside records:1} {Document your discussion with family members, caretakers, and with consultants:1} {Document social determinants of health affecting pt's care:1} {Document your decision making why or why not admission, treatments were needed:1} Final Clinical Impression(s) / ED Diagnoses Final diagnoses:  None    Rx / DC Orders ED Discharge Orders     None

## 2023-12-13 NOTE — ED Triage Notes (Signed)
 Pt POV reporting increased pain due to anal fissure. Seen by PCP, given nitroglycerin cream and told to use sitz baths at home with  no improvement.

## 2023-12-14 ENCOUNTER — Telehealth: Payer: Self-pay

## 2023-12-14 ENCOUNTER — Emergency Department (HOSPITAL_BASED_OUTPATIENT_CLINIC_OR_DEPARTMENT_OTHER)

## 2023-12-14 ENCOUNTER — Other Ambulatory Visit (HOSPITAL_BASED_OUTPATIENT_CLINIC_OR_DEPARTMENT_OTHER)

## 2023-12-14 MED ORDER — IOHEXOL 300 MG/ML  SOLN
100.0000 mL | Freq: Once | INTRAMUSCULAR | Status: AC | PRN
  Administered 2023-12-14: 100 mL via INTRAVENOUS

## 2023-12-14 NOTE — Telephone Encounter (Signed)
 Copied from CRM [id number]. Topic: General - Other >> Dec 14, 2023  4:13 PM Albertha Alosa wrote: Reason for CRM: Patient called in wanting to speak with NP Minna Amass or her Nurse regarding a followup question   [phone]

## 2023-12-14 NOTE — Telephone Encounter (Signed)
 Try sitz baths, prescribed creams, peri bottle with warm water to wash the area, etc. Thankfully CT did not show any abscesses. See was gen surg has to say. Hopefully he will feel better soon!!

## 2023-12-14 NOTE — Telephone Encounter (Signed)
 Spoke w/ Pt- he was seen on 12/12/23 w/ anal fissure, he has been using ointments as prescribed, he did have to go to the ED yesterday for worsening pain, they have referred him to [facility], I gave him their number to call and schedule a visit. He wanted further advice in the meantime because earlier today he started having discharge from the fissure area.

## 2023-12-14 NOTE — Telephone Encounter (Signed)
Spoke w/ Pt- informed of recommendations. Pt verbalized understanding.  

## 2023-12-14 NOTE — ED Notes (Signed)
 Patient transported to CT

## 2023-12-14 NOTE — Discharge Instructions (Signed)
 Try putting warm water over the area.  That can help clean the area as well as relax it.  Do this after bowel movements and before you go to bed.  A lot of times the hemorrhoid topical therapies can help a bit.  Keep using the nitroglycerin ointment and lidocaine as needed.  You need to try and keep your stools soft.  Taking Colace and that if you are not having soft bowel movements take MiraLAX.  Take 4 over the counter ibuprofen tablets 3 times a day or 2 over-the-counter naproxen tablets twice a day for pain. Also take tylenol  1000mg (2 extra strength) four times a day.

## 2023-12-14 NOTE — ED Notes (Signed)
 RN reviewed discharge instructions with pt. Pt verbalized understanding and had no further questions. VSS upon discharge.

## 2023-12-29 ENCOUNTER — Other Ambulatory Visit: Payer: Self-pay

## 2023-12-29 ENCOUNTER — Encounter (HOSPITAL_BASED_OUTPATIENT_CLINIC_OR_DEPARTMENT_OTHER): Payer: Self-pay

## 2023-12-29 DIAGNOSIS — Z87442 Personal history of urinary calculi: Secondary | ICD-10-CM | POA: Diagnosis not present

## 2023-12-29 DIAGNOSIS — N23 Unspecified renal colic: Secondary | ICD-10-CM | POA: Diagnosis not present

## 2023-12-29 DIAGNOSIS — R109 Unspecified abdominal pain: Secondary | ICD-10-CM | POA: Diagnosis present

## 2023-12-29 LAB — CBC WITH DIFFERENTIAL/PLATELET
Abs Immature Granulocytes: 0.02 10*3/uL (ref 0.00–0.07)
Basophils Absolute: 0 10*3/uL (ref 0.0–0.1)
Basophils Relative: 0 %
Eosinophils Absolute: 0 10*3/uL (ref 0.0–0.5)
Eosinophils Relative: 0 %
HCT: 42.4 % (ref 39.0–52.0)
Hemoglobin: 15.6 g/dL (ref 13.0–17.0)
Immature Granulocytes: 0 %
Lymphocytes Relative: 33 %
Lymphs Abs: 2.8 10*3/uL (ref 0.7–4.0)
MCH: 31.3 pg (ref 26.0–34.0)
MCHC: 36.8 g/dL — ABNORMAL HIGH (ref 30.0–36.0)
MCV: 85 fL (ref 80.0–100.0)
Monocytes Absolute: 0.6 10*3/uL (ref 0.1–1.0)
Monocytes Relative: 7 %
Neutro Abs: 4.9 10*3/uL (ref 1.7–7.7)
Neutrophils Relative %: 60 %
Platelets: 306 10*3/uL (ref 150–400)
RBC: 4.99 MIL/uL (ref 4.22–5.81)
RDW: 12.3 % (ref 11.5–15.5)
WBC: 8.3 10*3/uL (ref 4.0–10.5)
nRBC: 0 % (ref 0.0–0.2)

## 2023-12-29 LAB — BASIC METABOLIC PANEL WITH GFR
Anion gap: 19 — ABNORMAL HIGH (ref 5–15)
BUN: 11 mg/dL (ref 6–20)
CO2: 19 mmol/L — ABNORMAL LOW (ref 22–32)
Calcium: 10.1 mg/dL (ref 8.9–10.3)
Chloride: 100 mmol/L (ref 98–111)
Creatinine, Ser: 1.03 mg/dL (ref 0.61–1.24)
GFR, Estimated: >60 mL/min (ref >60)
Glucose, Bld: 118 mg/dL — ABNORMAL HIGH (ref 70–99)
Potassium: 3.5 mmol/L (ref 3.5–5.1)
Sodium: 138 mmol/L (ref 135–145)

## 2023-12-29 LAB — URINALYSIS, ROUTINE W REFLEX MICROSCOPIC
Bacteria, UA: NONE SEEN
Bilirubin Urine: NEGATIVE
Glucose, UA: NEGATIVE mg/dL
Ketones, ur: 40 mg/dL — AB
Leukocytes,Ua: NEGATIVE
Nitrite: NEGATIVE
Protein, ur: 30 mg/dL — AB
RBC / HPF: >50 RBC/hpf (ref 0–5)
Specific Gravity, Urine: 1.031 — ABNORMAL HIGH (ref 1.005–1.030)
pH: 6 (ref 5.0–8.0)

## 2023-12-29 MED ORDER — KETOROLAC TROMETHAMINE 30 MG/ML IJ SOLN
30.0000 mg | Freq: Once | INTRAMUSCULAR | Status: AC
  Administered 2023-12-29: 30 mg via INTRAMUSCULAR
  Filled 2023-12-29: qty 1

## 2023-12-29 MED ORDER — OXYCODONE-ACETAMINOPHEN 5-325 MG PO TABS
1.0000 | ORAL_TABLET | ORAL | Status: DC | PRN
  Administered 2023-12-29: 1 via ORAL
  Filled 2023-12-29: qty 1

## 2023-12-29 NOTE — ED Provider Triage Note (Signed)
 Emergency Medicine Provider Triage Evaluation Note  Warren Cole , a 23 y.o. male  was evaluated in triage.  Pt complains of left flank pain.  Patient presents with significant left flank pain, history of stones, feels similar.  Patient was seen in this ER earlier this month, incidental finding on CT of 2 mm nonobstructing left renal stone.  Denies fevers or chills.  Reports urinary urgency although difficulty voiding currently.  No changes in bowel habits.  No history of lithotripsy or stents.  Review of Systems  Positive:  Negative:   Physical Exam  BP (!) 160/114 (BP Location: Right Arm)   Pulse 87   Temp 98.2 F (36.8 C)   Resp 16   SpO2 97%  Gen:   Awake, appears to be in significant discomfort   Resp:  Normal effort  MSK:   Moves extremities without difficulty  Other:  No CVA tenderness.  Abdomen is soft and nontender.  Medical Decision Making  Medically screening exam initiated at 9:08 PM.  Appropriate orders placed.  Selinda Dales was informed that the remainder of the evaluation will be completed by another provider, this initial triage assessment does not replace that evaluation, and the importance of remaining in the ED until their evaluation is complete.   Provided with Percocet already in triage, will provide IM Toradol , obtain labs, will defer CT imaging at this time due to CT obtained already this month with left renal stone.    Darlis Eisenmenger, PA-C 12/29/23 2110

## 2023-12-29 NOTE — ED Triage Notes (Signed)
 Pt c/o L sided flank pain onset approx 1hr ago. Hx kidney stones, states it feels similar. Pt in visible pain during triage

## 2023-12-30 ENCOUNTER — Emergency Department (HOSPITAL_BASED_OUTPATIENT_CLINIC_OR_DEPARTMENT_OTHER)
Admission: EM | Admit: 2023-12-30 | Discharge: 2023-12-30 | Disposition: A | Discharge status: Home / Self Care | Attending: Emergency Medicine | Admitting: Emergency Medicine

## 2023-12-30 DIAGNOSIS — N23 Unspecified renal colic: Secondary | ICD-10-CM

## 2023-12-30 MED ORDER — TAMSULOSIN HCL 0.4 MG PO CAPS: 0.4000 mg | ORAL_CAPSULE | Freq: Every day | ORAL | 0 refills | Status: AC

## 2023-12-30 MED ORDER — OXYCODONE-ACETAMINOPHEN 5-325 MG PO TABS: 1.0000 | ORAL_TABLET | ORAL | 0 refills | Status: AC | PRN

## 2023-12-30 MED ORDER — HYDROMORPHONE HCL 1 MG/ML IJ SOLN
2.0000 mg | Freq: Once | INTRAMUSCULAR | Status: AC
  Administered 2023-12-30: 2 mg via INTRAMUSCULAR
  Filled 2023-12-30: qty 2

## 2023-12-30 MED ORDER — ONDANSETRON 4 MG PO TBDP
8.0000 mg | ORAL_TABLET | Freq: Once | ORAL | Status: AC
  Administered 2023-12-30: 8 mg via ORAL
  Filled 2023-12-30: qty 2

## 2023-12-30 NOTE — ED Provider Notes (Signed)
 [place] EMERGENCY DEPARTMENT AT [facility] Provider Note   CSN: [id number] Arrival date & time: 12/29/23  2052     History  No chief complaint on file.   Warren Cole is a 23 y.o. male.  Patient presents to the emergency department for evaluation of left flank pain that started approximately an hour before coming to the ED.  Patient reports a dull aching pain in the left posterior flank/back area with pain radiating towards the left groin.  He reports that he does have a history of kidney stones and this feels similar.       Home Medications Prior to Admission medications   Medication Sig Start Date End Date Taking? Authorizing Provider  oxyCODONE -acetaminophen  (PERCOCET) 5-325 MG tablet Take 1-2 tablets by mouth every 4 (four) hours as needed. 12/30/23  Yes Chrstopher Malenfant, Marine Sia, MD  tamsulosin  (FLOMAX ) 0.4 MG CAPS capsule Take 1 capsule (0.4 mg total) by mouth daily. 12/30/23  Yes Kiandra Sanguinetti, Marine Sia, MD  ALPRAZolam (XANAX) 1 MG tablet Take 1 mg by mouth daily as needed. 04/17/23   [provider]  CAPLYTA 42 MG capsule Take 42 mg by mouth daily. 04/17/23   [provider]  fluvoxaMINE (LUVOX) 100 MG tablet Take 100 mg by mouth 2 (two) times daily. 05/17/23   [provider]  lamoTRIgine (LAMICTAL) 200 MG tablet Take 200 mg by mouth at bedtime. 03/20/23   [provider]  lidocaine  (XYLOCAINE ) 2 % jelly Apply 1 Application topically as needed. 12/12/23   Everlina Hock, NP  Nitroglycerin  0.4 % OINT Place 1 inch of ointment rectally 2 (two) times daily. 12/12/23   Everlina Hock, NP  omeprazole  (PRILOSEC) 20 MG capsule Take 1 capsule (20 mg total) by mouth daily. 06/24/23 09/22/23  Brigitte Canard, PA-C  ondansetron  (ZOFRAN ) 4 MG tablet Take 1 tablet (4 mg total) by mouth every 8 (eight) hours as needed for nausea or vomiting. 05/19/23   Everlina Hock, NP      Allergies    Patient has no known allergies.    Review of Systems    Review of Systems  Physical Exam Updated Vital Signs BP 108/65   Pulse 71   Temp 98.3 F (36.8 C) (Oral)   Resp 16   Ht 6\' 4"  (1.93 m)   Wt 108.9 kg   SpO2 97%   BMI 29.21 kg/m  Physical Exam Vitals and nursing note reviewed.  Constitutional:      General: He is not in acute distress.    Appearance: He is well-developed.  HENT:     Head: Normocephalic and atraumatic.     Mouth/Throat:     Mouth: Mucous membranes are moist.  Eyes:     General: Vision grossly intact. Gaze aligned appropriately.     Extraocular Movements: Extraocular movements intact.     Conjunctiva/sclera: Conjunctivae Warren.  Cardiovascular:     Rate and Rhythm: Warren rate and regular rhythm.     Pulses: Warren pulses.     Heart sounds: Warren heart sounds, S1 Warren and S2 Warren. No murmur heard.    No friction rub. No gallop.  Pulmonary:     Effort: Pulmonary effort is Warren. No respiratory distress.     Breath sounds: Warren breath sounds.  Abdominal:     Palpations: Abdomen is soft.     Tenderness: There is no abdominal tenderness. There is no guarding or rebound.     Hernia: No hernia is present.  Musculoskeletal:  General: No swelling.     Cervical back: Full passive range of motion without pain, Warren range of motion and neck supple. No pain with movement, spinous process tenderness or muscular tenderness. Warren range of motion.     Right lower leg: No edema.     Left lower leg: No edema.  Skin:    General: Skin is warm and dry.     Capillary Refill: Capillary refill takes less than 2 seconds.     Findings: No ecchymosis, erythema, lesion or wound.  Neurological:     Mental Status: He is alert and oriented to person, place, and time.     GCS: GCS eye subscore is 4. GCS verbal subscore is 5. GCS motor subscore is 6.     Cranial Nerves: Cranial nerves 2-12 are intact.     Sensory: Sensation is intact.     Motor: Motor function is intact. No weakness or abnormal muscle tone.      Coordination: Coordination is intact.  Psychiatric:        Mood and Affect: Mood Warren.        Speech: Speech Warren.        Behavior: Behavior Warren.     ED Results / Procedures / Treatments   Labs (all labs ordered are listed, but only abnormal results are displayed) Labs Reviewed  URINALYSIS, ROUTINE W REFLEX MICROSCOPIC - Abnormal; Notable for the following components:      Result Value   Specific Gravity, Urine 1.031 (*)    Hgb urine dipstick LARGE (*)    Ketones, ur 40 (*)    Protein, ur 30 (*)    All other components within Warren limits  CBC WITH DIFFERENTIAL/PLATELET - Abnormal; Notable for the following components:   MCHC 36.8 (*)    All other components within Warren limits  BASIC METABOLIC PANEL WITH GFR - Abnormal; Notable for the following components:   CO2 19 (*)    Glucose, Bld 118 (*)    Anion gap 19 (*)    All other components within Warren limits    EKG None  Radiology No results found.  Procedures Procedures    Medications Ordered in ED Medications  oxyCODONE -acetaminophen  (PERCOCET/ROXICET) 5-325 MG per tablet 1 tablet (1 tablet Oral Given 12/29/23 2059)  HYDROmorphone  (DILAUDID ) injection 2 mg (has no administration in time range)  ondansetron  (ZOFRAN -ODT) disintegrating tablet 8 mg (has no administration in time range)  ketorolac  (TORADOL ) 30 MG/ML injection 30 mg (30 mg Intramuscular Given 12/29/23 2111)    ED Course/ Medical Decision Making/ A&P                                 Medical Decision Making Amount and/or Complexity of Data Reviewed Labs: ordered.  Risk Prescription drug management.   Differential Diagnosis considered includes, but not limited to: Renal colic/kidney stone; pyelonephritis; aortic dissection; musculoskeletal pain.  Patient had a CAT scan performed 2 weeks ago that showed incidental left renal stone.  Patient's urinalysis shows hemoglobin without signs of infection.  This is consistent with passage of the 2  mm stone that was seen incidentally previously.  Will be provided analgesia and follow-up with urology.  Given return precautions.        Final Clinical Impression(s) / ED Diagnoses Final diagnoses:  Renal colic on left side    Rx / DC Orders ED Discharge Orders  Ordered    tamsulosin  (FLOMAX ) 0.4 MG CAPS capsule  Daily        12/30/23 0142    oxyCODONE -acetaminophen  (PERCOCET) 5-325 MG tablet  Every 4 hours PRN        12/30/23 0142              Ariella Voit J, MD 12/30/23 [phone]
